# Patient Record
Sex: Female | Born: 1955 | Race: White | Hispanic: No | Smoking: Former smoker
Health system: Southern US, Community
[De-identification: ages and names within clinical notes are randomized; demographics above are authoritative.]

## PROBLEM LIST (undated history)

## (undated) DIAGNOSIS — C4491 Basal cell carcinoma of skin, unspecified: Secondary | ICD-10-CM

## (undated) DIAGNOSIS — K219 Gastro-esophageal reflux disease without esophagitis: Secondary | ICD-10-CM

## (undated) DIAGNOSIS — M1990 Unspecified osteoarthritis, unspecified site: Secondary | ICD-10-CM

## (undated) DIAGNOSIS — C44729 Squamous cell carcinoma of skin of left lower limb, including hip: Secondary | ICD-10-CM

## (undated) DIAGNOSIS — I10 Essential (primary) hypertension: Secondary | ICD-10-CM

## (undated) DIAGNOSIS — I82890 Acute embolism and thrombosis of other specified veins: Secondary | ICD-10-CM

## (undated) DIAGNOSIS — E538 Deficiency of other specified B group vitamins: Secondary | ICD-10-CM

## (undated) DIAGNOSIS — Z9889 Other specified postprocedural states: Secondary | ICD-10-CM

## (undated) DIAGNOSIS — G4733 Obstructive sleep apnea (adult) (pediatric): Secondary | ICD-10-CM

## (undated) DIAGNOSIS — E559 Vitamin D deficiency, unspecified: Secondary | ICD-10-CM

## (undated) DIAGNOSIS — E7800 Pure hypercholesterolemia, unspecified: Secondary | ICD-10-CM

## (undated) DIAGNOSIS — R112 Nausea with vomiting, unspecified: Secondary | ICD-10-CM

## (undated) HISTORY — PX: TUBAL LIGATION: SHX77

## (undated) HISTORY — PX: COLONOSCOPY: SHX174

## (undated) HISTORY — PX: APPENDECTOMY: SHX54

---
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
## 1997-09-25 HISTORY — PX: TOTAL ABDOMINAL HYSTERECTOMY W/ BILATERAL SALPINGOOPHORECTOMY: SHX83

## 2001-09-25 HISTORY — PX: ROTATOR CUFF REPAIR: SHX139

## 2003-09-26 DIAGNOSIS — K819 Cholecystitis, unspecified: Secondary | ICD-10-CM

## 2003-09-26 HISTORY — PX: CHOLECYSTECTOMY: SHX55

## 2003-09-26 HISTORY — DX: Cholecystitis, unspecified: K81.9

## 2005-08-13 ENCOUNTER — Emergency Department: Payer: Self-pay | Admitting: Emergency Medicine

## 2006-08-02 ENCOUNTER — Emergency Department: Payer: Self-pay

## 2009-04-30 ENCOUNTER — Emergency Department: Payer: Self-pay | Admitting: Internal Medicine

## 2010-06-29 ENCOUNTER — Emergency Department: Payer: Self-pay | Admitting: Internal Medicine

## 2011-09-26 DIAGNOSIS — M810 Age-related osteoporosis without current pathological fracture: Secondary | ICD-10-CM

## 2011-09-26 HISTORY — DX: Age-related osteoporosis without current pathological fracture: M81.0

## 2011-11-30 DIAGNOSIS — C4491 Basal cell carcinoma of skin, unspecified: Secondary | ICD-10-CM | POA: Insufficient documentation

## 2011-11-30 DIAGNOSIS — H9313 Tinnitus, bilateral: Secondary | ICD-10-CM | POA: Insufficient documentation

## 2011-11-30 DIAGNOSIS — K219 Gastro-esophageal reflux disease without esophagitis: Secondary | ICD-10-CM | POA: Insufficient documentation

## 2012-01-18 IMAGING — CT CT HEAD WITHOUT CONTRAST
2 series · 16 of 30 positions shown, 20 images · non-contrast
Comparison: none

REASON FOR EXAM: dizzy
COMMENTS:

PROCEDURE:     CT  - CT HEAD WITHOUT CONTRAST  - June 29, 2010  [DATE]
RESULT:     Comparison:  None
TECHNIQUE: Multiple axial images from the foramen magnum to the vertex were
obtained without IV contrast.

[Series 2: without · axial · non-contrast · 0.41mm/px · z∈[+948,+1084]mm · 13 of 33 slices shown, 17 images]
[im 3/33  brain]
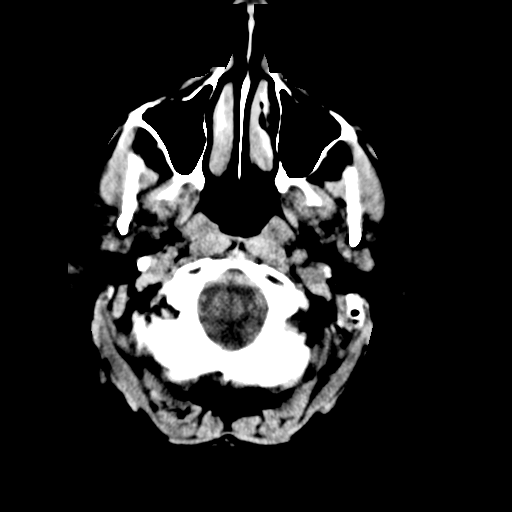
[im 3/33  bone]
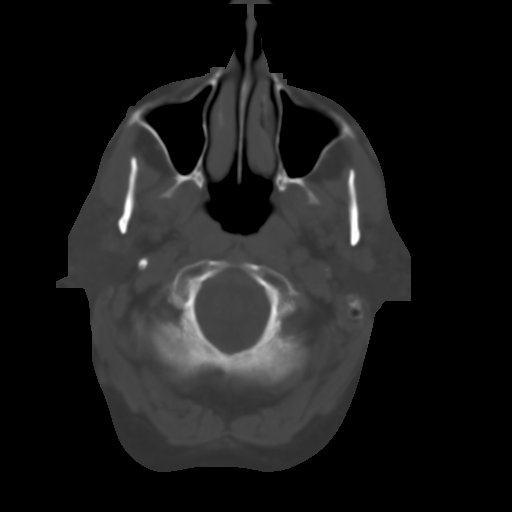
[im 5/33  brain]
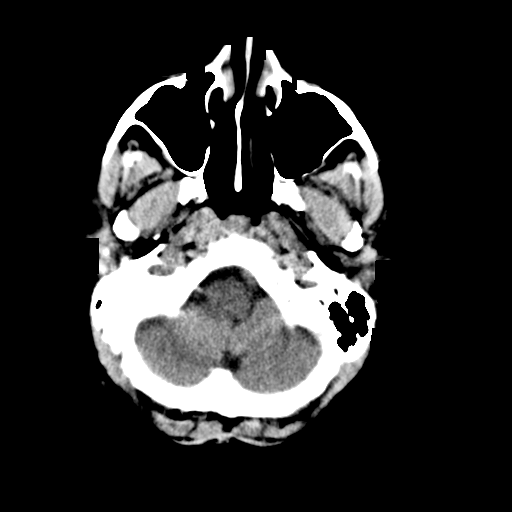
[im 7/33  brain]
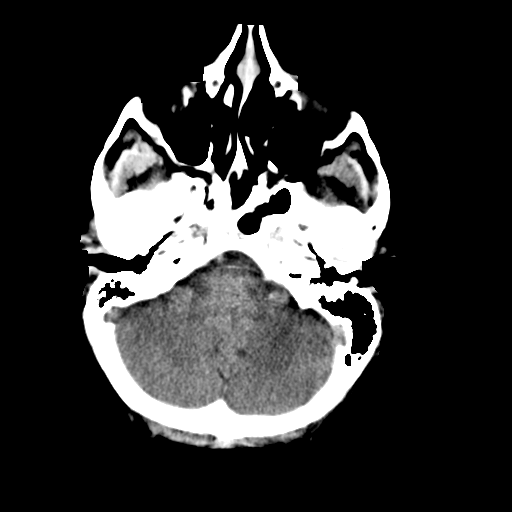
[im 10/33  brain]
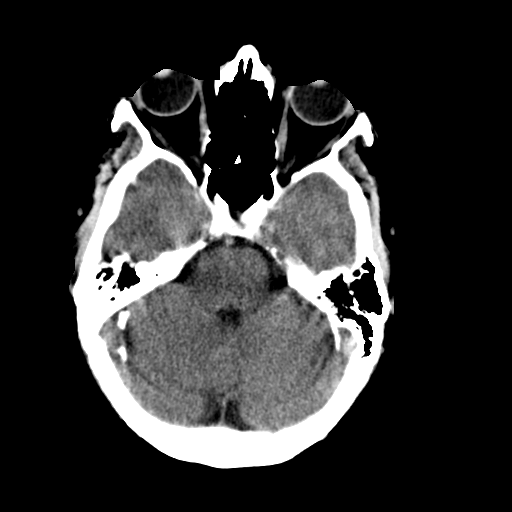
[im 12/33  brain]
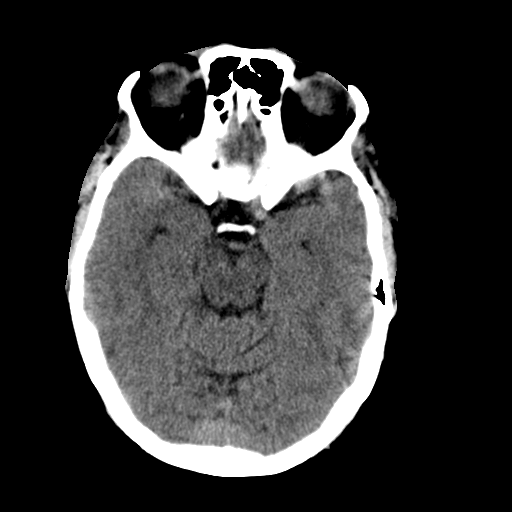
[im 12/33  bone]
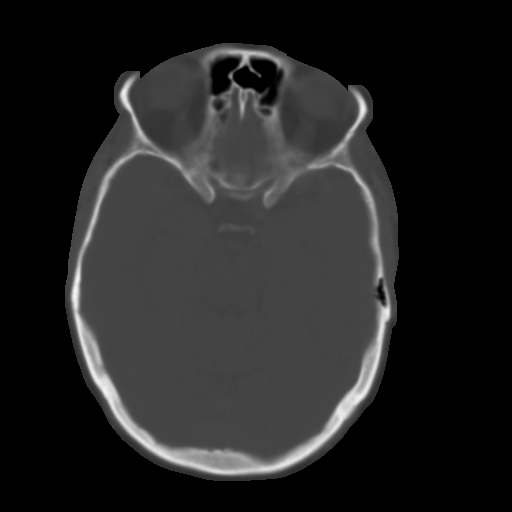
[im 14/33  brain]
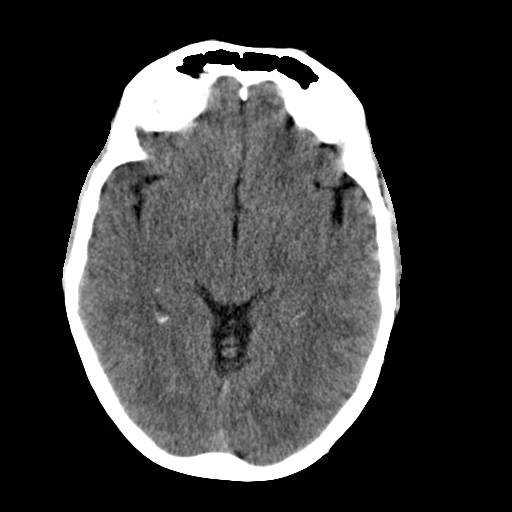
[im 17/33  brain]
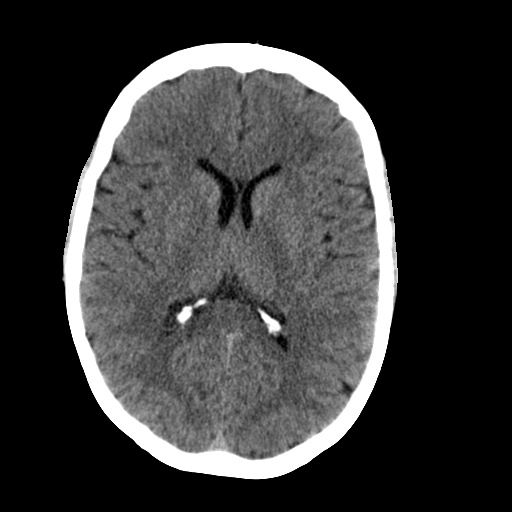
[im 19/33  brain]
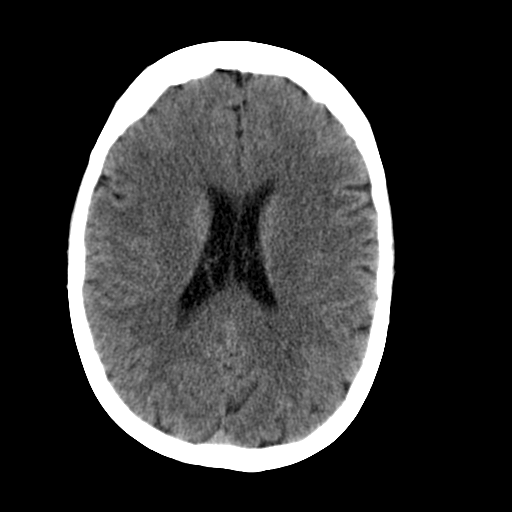
[im 21/33  brain]
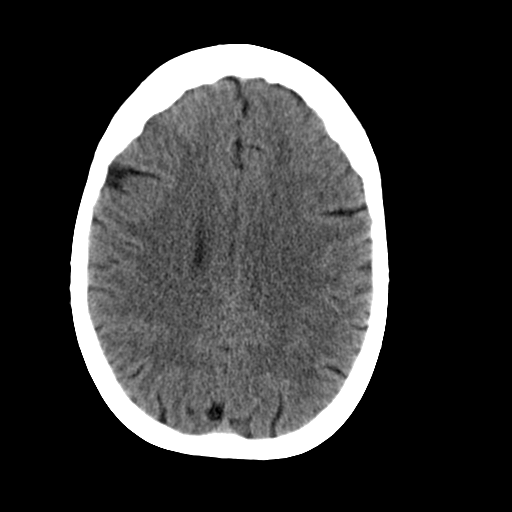
[im 21/33  bone]
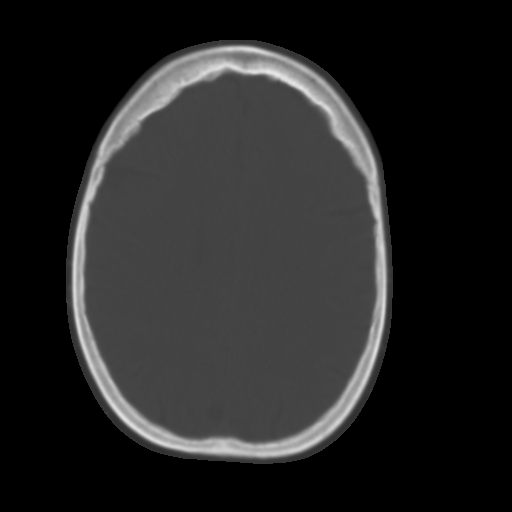
[im 23/33  brain]
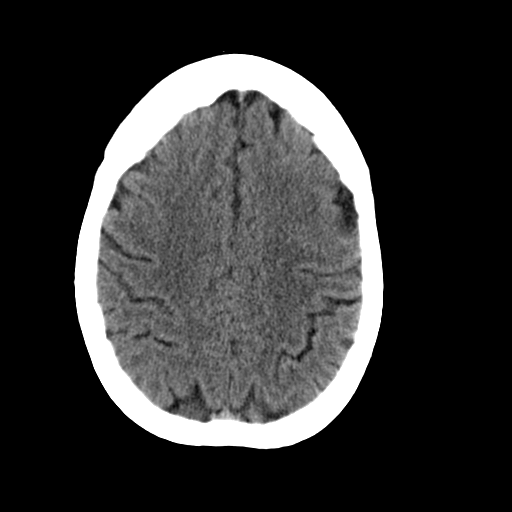
[im 26/33  brain]
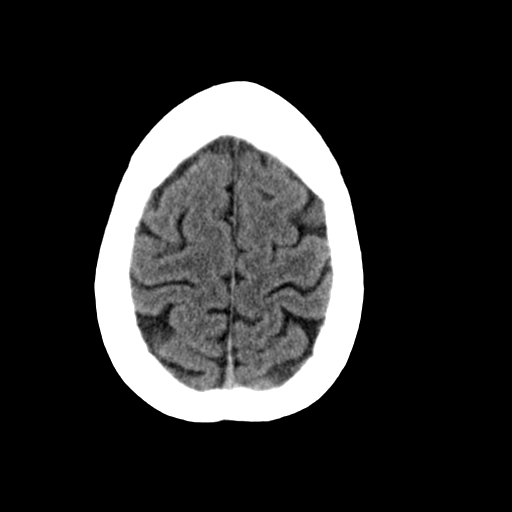
[im 28/33  brain]
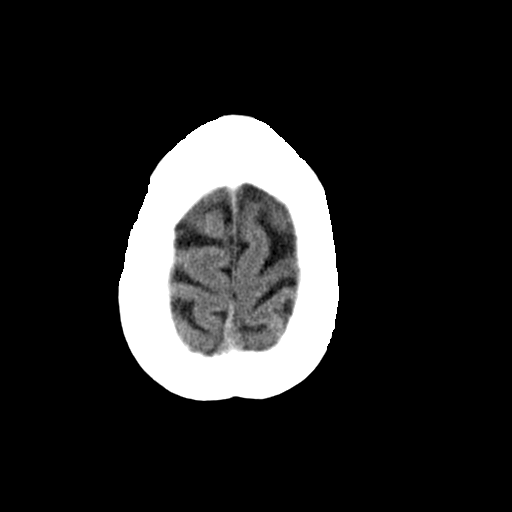
[im 30/33  brain]
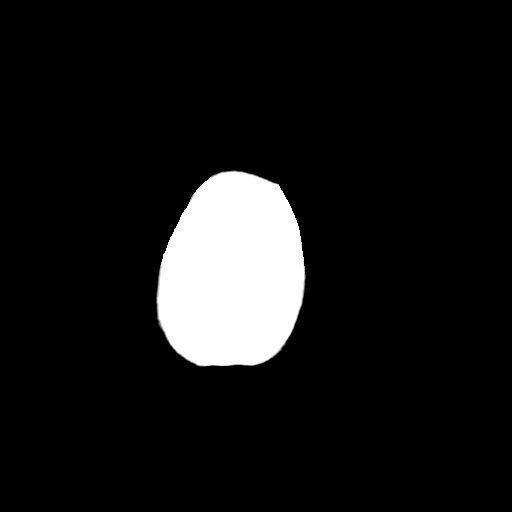
[im 30/33  bone]
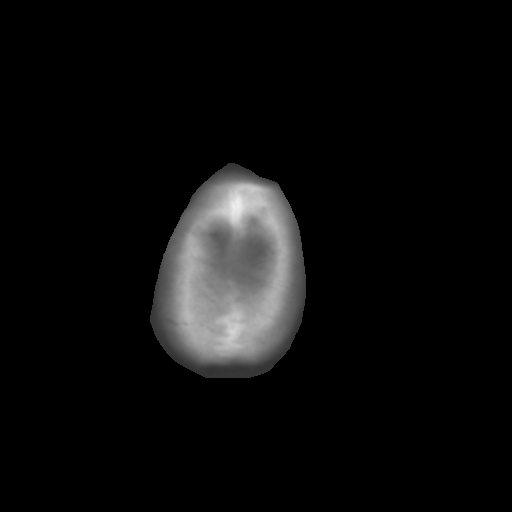

[Series 3: bone · axial · 0.41mm/px · z∈[+948,+994]mm · 3 of 33 slices shown]
[im 3/33  bone]
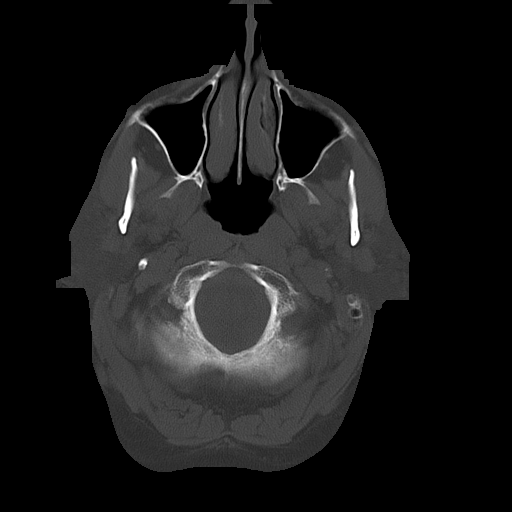
[im 7/33  bone]
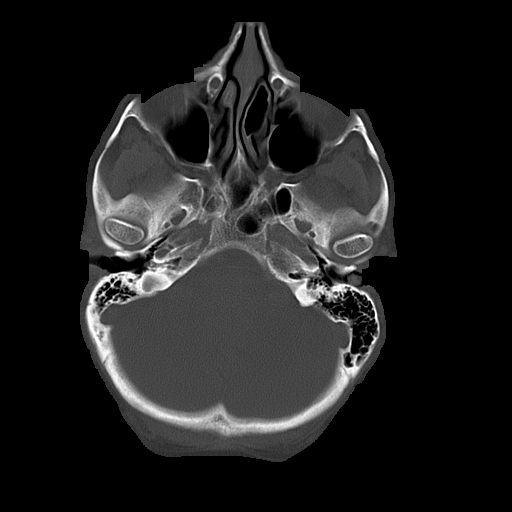
[im 12/33  bone]
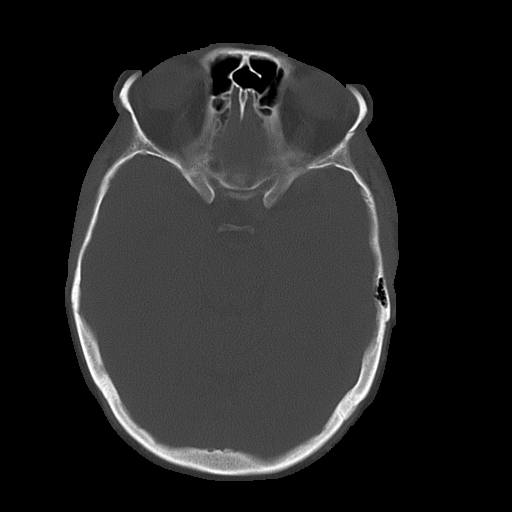

[16 of 30 positions shown; findings below may reference images not displayed]

FINDINGS: There is no evidence for mass effect, midline shift, or extra-axial fluid
collections. There is no evidence for space-occupying lesion, intracranial
hemorrhage, or cortical-based area of infarction.

The osseous structures are unremarkable.
IMPRESSION: No acute intracranial process.

## 2012-02-09 DIAGNOSIS — I10 Essential (primary) hypertension: Secondary | ICD-10-CM | POA: Insufficient documentation

## 2012-04-11 DIAGNOSIS — E559 Vitamin D deficiency, unspecified: Secondary | ICD-10-CM | POA: Insufficient documentation

## 2012-04-11 DIAGNOSIS — M810 Age-related osteoporosis without current pathological fracture: Secondary | ICD-10-CM | POA: Insufficient documentation

## 2012-12-10 DIAGNOSIS — M25561 Pain in right knee: Secondary | ICD-10-CM | POA: Insufficient documentation

## 2013-02-11 DIAGNOSIS — D369 Benign neoplasm, unspecified site: Secondary | ICD-10-CM | POA: Insufficient documentation

## 2013-09-25 DIAGNOSIS — K5792 Diverticulitis of intestine, part unspecified, without perforation or abscess without bleeding: Secondary | ICD-10-CM

## 2013-09-25 HISTORY — DX: Diverticulitis of intestine, part unspecified, without perforation or abscess without bleeding: K57.92

## 2013-12-17 HISTORY — PX: LAPAROSCOPIC PARTIAL COLECTOMY: SHX5907

## 2014-03-10 DIAGNOSIS — G4733 Obstructive sleep apnea (adult) (pediatric): Secondary | ICD-10-CM | POA: Insufficient documentation

## 2015-02-03 DIAGNOSIS — M810 Age-related osteoporosis without current pathological fracture: Secondary | ICD-10-CM | POA: Insufficient documentation

## 2016-03-10 DIAGNOSIS — N3946 Mixed incontinence: Secondary | ICD-10-CM | POA: Insufficient documentation

## 2016-03-10 DIAGNOSIS — E7800 Pure hypercholesterolemia, unspecified: Secondary | ICD-10-CM | POA: Insufficient documentation

## 2016-06-16 DIAGNOSIS — H8112 Benign paroxysmal vertigo, left ear: Secondary | ICD-10-CM | POA: Insufficient documentation

## 2016-06-16 DIAGNOSIS — H903 Sensorineural hearing loss, bilateral: Secondary | ICD-10-CM | POA: Insufficient documentation

## 2017-03-11 DIAGNOSIS — E782 Mixed hyperlipidemia: Secondary | ICD-10-CM | POA: Insufficient documentation

## 2017-07-05 DIAGNOSIS — M1712 Unilateral primary osteoarthritis, left knee: Secondary | ICD-10-CM | POA: Insufficient documentation

## 2017-09-25 DIAGNOSIS — L03115 Cellulitis of right lower limb: Secondary | ICD-10-CM

## 2017-09-25 HISTORY — DX: Cellulitis of right lower limb: L03.115

## 2018-03-11 HISTORY — PX: TOTAL KNEE ARTHROPLASTY: SHX125

## 2018-05-20 DIAGNOSIS — Z96651 Presence of right artificial knee joint: Secondary | ICD-10-CM | POA: Insufficient documentation

## 2018-09-25 DIAGNOSIS — C44622 Squamous cell carcinoma of skin of right upper limb, including shoulder: Secondary | ICD-10-CM

## 2018-09-25 HISTORY — DX: Squamous cell carcinoma of skin of right upper limb, including shoulder: C44.622

## 2019-04-08 DIAGNOSIS — M8580 Other specified disorders of bone density and structure, unspecified site: Secondary | ICD-10-CM | POA: Insufficient documentation

## 2021-01-18 DIAGNOSIS — Z85828 Personal history of other malignant neoplasm of skin: Secondary | ICD-10-CM | POA: Insufficient documentation

## 2021-08-15 NOTE — Discharge Instructions (Signed)
Instructions after Total Knee Replacement   Raelie Lohr P. Mervil Wacker, Jr., M.D.     Dept. of Orthopaedics & Sports Medicine  [facility]  [address]  [place], [place]  [address]  Phone: [phone]   Fax: [phone]    DIET: Drink plenty of non-alcoholic fluids. Resume your normal diet. Include foods high in fiber.  ACTIVITY:  You may use crutches or a walker with weight-bearing as tolerated, unless instructed otherwise. You may be weaned off of the walker or crutches by your Physical Therapist.  Do NOT place pillows under the knee. Anything placed under the knee could limit your ability to straighten the knee.   Continue doing gentle exercises. Exercising will reduce the pain and swelling, increase motion, and prevent muscle weakness.   Please continue to use the TED compression stockings for 6 weeks. You may remove the stockings at night, but should reapply them in the morning. Do not drive or operate any equipment until instructed.  WOUND CARE:  Continue to use the PolarCare or ice packs periodically to reduce pain and swelling. You may bathe or shower after the staples are removed at the first office visit following surgery.  MEDICATIONS: You may resume your regular medications. Please take the pain medication as prescribed on the medication. Do not take pain medication on an empty stomach. You have been given a prescription for a blood thinner (Lovenox or Coumadin). Please take the medication as instructed. (NOTE: After completing a 2 week course of Lovenox, take one Enteric-coated aspirin once a day. This along with elevation will help reduce the possibility of phlebitis in your operated leg.) Do not drive or drink alcoholic beverages when taking pain medications.  CALL THE OFFICE FOR: Temperature above 101 degrees Excessive bleeding or drainage on the dressing. Excessive swelling, coldness, or paleness of the toes. Persistent nausea and vomiting.  FOLLOW-UP:  You  should have an appointment to return to the office in 10-14 days after surgery. Arrangements have been made for continuation of Physical Therapy (either home therapy or outpatient therapy).   [facility] Department Directory         [email or web address]       [email or web address]          Cardiology  Appointments: [place] - [phone] [place] - [phone]  Endocrinology  Appointments: [place] - [phone] [place] - [phone]  Gastroenterology  Appointments: [place] - [phone] [place] - [phone]        General Surgery   Appointments: [place] - [phone]  Internal Medicine/Family Medicine  Appointments: [place] - [phone] [place] - [phone] [place] - [phone]  Metabolic and Weigh Loss Surgery  Appointments: [place] - [phone]        Neurology  Appointments: [place] - [phone] [place] - [phone]  Neurosurgery  Appointments: [place] - [phone]  Obstetrics & Gynecology  Appointments: [place] - [phone] [place] - [phone]        Pediatrics  Appointments: [place] - [phone] [place] - [phone]  Physiatry  Appointments: [place] -[phone]  Physical Therapy  Appointments: [place] - [phone] [place] - [phone]        Podiatry  Appointments: [place] - [phone] [place] - [phone]  Pulmonology  Appointments: [place] - [phone]  Rheumatology  Appointments: [place] - [phone]        [place] Location: [facility]  [address] , [place]  [address]  [place] Location: [facility] [address] [place], [place]  [address]  [place] Location: [facility] [address] [place], [place]  [address]    

## 2021-08-24 ENCOUNTER — Other Ambulatory Visit: Payer: Self-pay

## 2021-09-02 ENCOUNTER — Other Ambulatory Visit: Payer: Self-pay

## 2021-09-05 DIAGNOSIS — M1712 Unilateral primary osteoarthritis, left knee: Secondary | ICD-10-CM

## 2021-09-25 DIAGNOSIS — T451X5A Adverse effect of antineoplastic and immunosuppressive drugs, initial encounter: Secondary | ICD-10-CM

## 2021-09-25 HISTORY — PX: PORTA CATH INSERTION: CATH118285

## 2021-09-25 HISTORY — DX: Adverse effect of antineoplastic and immunosuppressive drugs, initial encounter: T45.1X5A

## 2021-10-26 DIAGNOSIS — C50911 Malignant neoplasm of unspecified site of right female breast: Secondary | ICD-10-CM

## 2021-10-26 HISTORY — DX: Malignant neoplasm of unspecified site of right female breast: C50.911

## 2021-10-26 HISTORY — PX: BREAST BIOPSY: SHX20

## 2021-10-27 DIAGNOSIS — E669 Obesity, unspecified: Secondary | ICD-10-CM | POA: Insufficient documentation

## 2021-10-27 DIAGNOSIS — G8929 Other chronic pain: Secondary | ICD-10-CM | POA: Insufficient documentation

## 2021-10-27 DIAGNOSIS — Z9889 Other specified postprocedural states: Secondary | ICD-10-CM | POA: Insufficient documentation

## 2021-11-07 ENCOUNTER — Inpatient Hospital Stay: Admission: RE | Admit: 2021-11-07 | Payer: Self-pay | Source: Ambulatory Visit

## 2021-11-18 ENCOUNTER — Other Ambulatory Visit: Admission: RE | Admit: 2021-11-18 | Payer: Self-pay | Source: Ambulatory Visit

## 2021-11-21 ENCOUNTER — Ambulatory Visit: Admit: 2021-11-21 | Payer: Self-pay | Admitting: Orthopedic Surgery

## 2021-11-21 DIAGNOSIS — M1712 Unilateral primary osteoarthritis, left knee: Secondary | ICD-10-CM

## 2021-11-21 SURGERY — ARTHROPLASTY, KNEE, TOTAL, USING IMAGELESS COMPUTER-ASSISTED NAVIGATION
Anesthesia: Choice | Site: Knee | Laterality: Left

## 2021-12-23 DIAGNOSIS — K1231 Oral mucositis (ulcerative) due to antineoplastic therapy: Secondary | ICD-10-CM | POA: Insufficient documentation

## 2021-12-23 DIAGNOSIS — K0381 Cracked tooth: Secondary | ICD-10-CM | POA: Insufficient documentation

## 2022-04-11 HISTORY — PX: BREAST LUMPECTOMY WITH AXILLARY LYMPH NODE BIOPSY: SHX5593

## 2022-12-07 DIAGNOSIS — G620 Drug-induced polyneuropathy: Secondary | ICD-10-CM | POA: Insufficient documentation

## 2022-12-07 DIAGNOSIS — D696 Thrombocytopenia, unspecified: Secondary | ICD-10-CM | POA: Insufficient documentation

## 2022-12-07 DIAGNOSIS — E538 Deficiency of other specified B group vitamins: Secondary | ICD-10-CM | POA: Insufficient documentation

## 2022-12-07 DIAGNOSIS — E876 Hypokalemia: Secondary | ICD-10-CM | POA: Insufficient documentation

## 2022-12-07 DIAGNOSIS — R7303 Prediabetes: Secondary | ICD-10-CM | POA: Insufficient documentation

## 2023-04-26 HISTORY — PX: PORT-A-CATH REMOVAL: SHX5289

## 2023-05-20 NOTE — Discharge Instructions (Signed)

## 2023-06-15 ENCOUNTER — Inpatient Hospital Stay: Admission: RE | Admit: 2023-06-15 | Payer: Self-pay | Source: Ambulatory Visit

## 2023-06-18 ENCOUNTER — Other Ambulatory Visit: Payer: Self-pay

## 2023-06-18 ENCOUNTER — Encounter
Admission: RE | Admit: 2023-06-18 | Discharge: 2023-06-18 | Disposition: A | Discharge status: Home / Self Care | Payer: Medicare HMO | Source: Ambulatory Visit | Attending: Orthopedic Surgery | Admitting: Orthopedic Surgery

## 2023-06-18 DIAGNOSIS — M1712 Unilateral primary osteoarthritis, left knee: Secondary | ICD-10-CM | POA: Diagnosis not present

## 2023-06-18 DIAGNOSIS — Z862 Personal history of diseases of the blood and blood-forming organs and certain disorders involving the immune mechanism: Secondary | ICD-10-CM | POA: Insufficient documentation

## 2023-06-18 DIAGNOSIS — Z01812 Encounter for preprocedural laboratory examination: Secondary | ICD-10-CM | POA: Insufficient documentation

## 2023-06-18 DIAGNOSIS — Z9221 Personal history of antineoplastic chemotherapy: Secondary | ICD-10-CM | POA: Diagnosis not present

## 2023-06-18 DIAGNOSIS — Z853 Personal history of malignant neoplasm of breast: Secondary | ICD-10-CM | POA: Insufficient documentation

## 2023-06-18 HISTORY — DX: Obstructive sleep apnea (adult) (pediatric): G47.33

## 2023-06-18 HISTORY — DX: Gastro-esophageal reflux disease without esophagitis: K21.9

## 2023-06-18 HISTORY — DX: Other specified postprocedural states: R11.2

## 2023-06-18 HISTORY — DX: Unspecified osteoarthritis, unspecified site: M19.90

## 2023-06-18 HISTORY — DX: Acute embolism and thrombosis of other specified veins: I82.890

## 2023-06-18 HISTORY — DX: Other specified postprocedural states: Z98.890

## 2023-06-18 HISTORY — DX: Deficiency of other specified B group vitamins: E53.8

## 2023-06-18 HISTORY — DX: Squamous cell carcinoma of skin of left lower limb, including hip: C44.729

## 2023-06-18 HISTORY — DX: Vitamin D deficiency, unspecified: E55.9

## 2023-06-18 HISTORY — DX: Essential (primary) hypertension: I10

## 2023-06-18 HISTORY — DX: Pure hypercholesterolemia, unspecified: E78.00

## 2023-06-18 HISTORY — DX: Basal cell carcinoma of skin, unspecified: C44.91

## 2023-06-18 LAB — TYPE AND SCREEN
ABO/RH(D): O POS
Antibody Screen: NEGATIVE

## 2023-06-18 LAB — COMPREHENSIVE METABOLIC PANEL
ALT: 21 U/L (ref 0–44)
AST: 26 U/L (ref 15–41)
Albumin: 4.1 g/dL (ref 3.5–5.0)
Alkaline Phosphatase: 44 U/L (ref 38–126)
Anion gap: 12 (ref 5–15)
BUN: 19 mg/dL (ref 8–23)
CO2: 24 mmol/L (ref 22–32)
Calcium: 9.1 mg/dL (ref 8.9–10.3)
Chloride: 102 mmol/L (ref 98–111)
Creatinine, Ser: 0.65 mg/dL (ref 0.44–1.00)
GFR, Estimated: >60 mL/min (ref >60)
Glucose, Bld: 127 mg/dL — ABNORMAL HIGH (ref 70–99)
Potassium: 4 mmol/L (ref 3.5–5.1)
Sodium: 138 mmol/L (ref 135–145)
Total Bilirubin: 0.8 mg/dL (ref 0.3–1.2)
Total Protein: 7.7 g/dL (ref 6.5–8.1)

## 2023-06-18 LAB — CBC
HCT: 41.4 % (ref 36.0–46.0)
Hemoglobin: 14.1 g/dL (ref 12.0–15.0)
MCH: 29.9 pg (ref 26.0–34.0)
MCHC: 34.1 g/dL (ref 30.0–36.0)
MCV: 87.7 fL (ref 80.0–100.0)
Platelets: 122 10*3/uL — ABNORMAL LOW (ref 150–400)
RBC: 4.72 MIL/uL (ref 3.87–5.11)
RDW: 13.4 % (ref 11.5–15.5)
WBC: 5.5 10*3/uL (ref 4.0–10.5)
nRBC: 0 % (ref 0.0–0.2)

## 2023-06-18 LAB — URINALYSIS, ROUTINE W REFLEX MICROSCOPIC
Bacteria, UA: NONE SEEN
Bilirubin Urine: NEGATIVE
Glucose, UA: NEGATIVE mg/dL
Hgb urine dipstick: NEGATIVE
Ketones, ur: NEGATIVE mg/dL
Nitrite: NEGATIVE
Protein, ur: NEGATIVE mg/dL
Specific Gravity, Urine: 1.02 (ref 1.005–1.030)
pH: 5 (ref 5.0–8.0)

## 2023-06-18 LAB — SURGICAL PCR SCREEN
MRSA, PCR: NEGATIVE
Staphylococcus aureus: POSITIVE — AB

## 2023-06-18 LAB — C-REACTIVE PROTEIN: CRP: 0.6 mg/dL (ref <1.0)

## 2023-06-18 LAB — SEDIMENTATION RATE: Sed Rate: 25 mm/hr (ref 0–30)

## 2023-06-18 NOTE — Patient Instructions (Addendum)
Your procedure is scheduled on: Friday, September 27 Report to the Registration Desk on the 1st floor of the [facility] To find out your arrival time, please call [phone] between 1PM - 3PM on: Thursday, September 26 If your arrival time is 6:00 am, do not arrive before that time as the [facility] entrance doors do not open until 6:00 am.  REMEMBER: Instructions that are not followed completely may result in serious medical risk, up to and including death; or upon the discretion of your surgeon and anesthesiologist your surgery may need to be rescheduled.  Do not eat food after midnight the night before surgery.  No gum chewing or hard candies.  You may however, drink CLEAR liquids up to 2 hours before you are scheduled to arrive for your surgery. Do not drink anything within 2 hours of your scheduled arrival time.  Clear liquids include: - water  - apple juice without pulp - gatorade (not RED colors) - black coffee or tea (Do NOT add milk or creamers to the coffee or tea) Do NOT drink anything that is not on this list.  In addition, your doctor has ordered for you to drink the provided:  Ensure Pre-Surgery Clear Carbohydrate Drink  Drinking this carbohydrate drink up to two hours before surgery helps to reduce insulin resistance and improve patient outcomes. Please complete drinking 2 hours before scheduled arrival time.  One week prior to surgery: starting today, September 23 Stop Anti-inflammatories (NSAIDS) such as Advil, Aleve, Ibuprofen, Motrin, Naproxen, Naprosyn and Aspirin based products such as Excedrin, Goody's Powder, BC Powder. Stop ANY OVER THE COUNTER supplements until after surgery. You may however, continue to take Tylenol if needed for pain up until the day of surgery.  Continue taking all prescribed medications   TAKE ONLY THESE MEDICATIONS THE MORNING OF SURGERY WITH A SIP OF WATER:  Letrozole (Femara) Pantoprazole (Protonix) Simvastatin  (Zocor)  Take a famotidine (Pepcid) the night before, it will help to prevent nausea after surgery.  No Alcohol for 24 hours before or after surgery.  No Smoking including e-cigarettes for 24 hours before surgery.  No chewable tobacco products for at least 6 hours before surgery.  No nicotine patches on the day of surgery.  Do not use any "recreational" drugs for at least a week (preferably 2 weeks) before your surgery.  Please be advised that the combination of cocaine and anesthesia may have negative outcomes, up to and including death. If you test positive for cocaine, your surgery will be cancelled.  On the morning of surgery brush your teeth with toothpaste and water, you may rinse your mouth with mouthwash if you wish. Do not swallow any toothpaste or mouthwash.  Use CHG Soap as directed on instruction sheet.  Do not wear jewelry, make-up, hairpins, clips or nail polish.  For welded (permanent) jewelry: bracelets, anklets, waist bands, etc.  Please have this removed prior to surgery.  If it is not removed, there is a chance that hospital personnel will need to cut it off on the day of surgery.  Do not wear lotions, powders, or perfumes.   Do not shave body hair from the neck down 48 hours before surgery.  Contact lenses, hearing aids and dentures may not be worn into surgery.  Do not bring valuables to the hospital. [facility] is not responsible for any missing/lost belongings or valuables.   Notify your doctor if there is any change in your medical condition (cold, fever, infection).  Wear comfortable  clothing (specific to your surgery type) to the hospital.  After surgery, you can help prevent lung complications by doing breathing exercises.  Take deep breaths and cough every 1-2 hours. Your doctor may order a device called an Incentive Spirometer to help you take deep breaths.  If you are being admitted to the hospital overnight, leave your suitcase in the car. After  surgery it may be brought to your room.  In case of increased patient census, it may be necessary for you, the patient, to continue your postoperative care in the Same Day Surgery department.  If you are being discharged the day of surgery, you will not be allowed to drive home. You will need a responsible individual to drive you home and stay with you for 24 hours after surgery.   If you are taking public transportation, you will need to have a responsible individual with you.  Please call the Pre-admissions Testing Dept. at [phone] if you have any questions about these instructions.  Surgery Visitation Policy:  Patients having surgery or a procedure may have two visitors.  Children under the age of 26 must have an adult with them who is not the patient.  Inpatient Visitation:    Visiting hours are 7 a.m. to 8 p.m. Up to four visitors are allowed at one time in a patient room. The visitors may rotate out with other people during the day.  One visitor age 27 or older may stay with the patient overnight and must be in the room by 8 p.m.      Pre-operative 5 CHG Bath Instructions   You can play a key role in reducing the risk of infection after surgery. Your skin needs to be as free of germs as possible. You can reduce the number of germs on your skin by washing with CHG (chlorhexidine gluconate) soap before surgery. CHG is an antiseptic soap that kills germs and continues to kill germs even after washing.   DO NOT use if you have an allergy to chlorhexidine/CHG or antibacterial soaps. If your skin becomes reddened or irritated, stop using the CHG and notify one of our RNs at [phone].   Please shower with the CHG soap starting 4 days before surgery using the following schedule:     Please keep in mind the following:  DO NOT shave, including legs and underarms, starting the day of your first shower.   You may shave your face at any point before/day of surgery.  Place  clean sheets on your bed the day you start using CHG soap. Use a clean washcloth (not used since being washed) for each shower. DO NOT sleep with pets once you start using the CHG.   CHG Shower Instructions:  If you choose to wash your hair and private area, wash first with your normal shampoo/soap.  After you use shampoo/soap, rinse your hair and body thoroughly to remove shampoo/soap residue.  Turn the water OFF and apply about 3 tablespoons (45 ml) of CHG soap to a CLEAN washcloth.  Apply CHG soap ONLY FROM YOUR NECK DOWN TO YOUR TOES (washing for 3-5 minutes)  DO NOT use CHG soap on face, private areas, open wounds, or sores.  Pay special attention to the area where your surgery is being performed.  If you are having back surgery, having someone wash your back for you may be helpful. Wait 2 minutes after CHG soap is applied, then you may rinse off the CHG soap.  Pat dry with  a clean towel  Put on clean clothes/pajamas   If you choose to wear lotion, please use ONLY the CHG-compatible lotions on the back of this paper.     Additional instructions for the day of surgery: DO NOT APPLY any lotions, deodorants, cologne, or perfumes.   Put on clean/comfortable clothes.  Brush your teeth.  Ask your nurse before applying any prescription medications to the skin.      CHG Compatible Lotions   Aveeno Moisturizing lotion  Cetaphil Moisturizing Cream  Cetaphil Moisturizing Lotion  Clairol Herbal Essence Moisturizing Lotion, Dry Skin  Clairol Herbal Essence Moisturizing Lotion, Extra Dry Skin  Clairol Herbal Essence Moisturizing Lotion, Normal Skin  Curel Age Defying Therapeutic Moisturizing Lotion with Alpha Hydroxy  Curel Extreme Care Body Lotion  Curel Soothing Hands Moisturizing Hand Lotion  Curel Therapeutic Moisturizing Cream, Fragrance-Free  Curel Therapeutic Moisturizing Lotion, Fragrance-Free  Curel Therapeutic Moisturizing Lotion, Original Formula  Eucerin Daily Replenishing  Lotion  Eucerin Dry Skin Therapy Plus Alpha Hydroxy Crme  Eucerin Dry Skin Therapy Plus Alpha Hydroxy Lotion  Eucerin Original Crme  Eucerin Original Lotion  Eucerin Plus Crme Eucerin Plus Lotion  Eucerin TriLipid Replenishing Lotion  Keri Anti-Bacterial Hand Lotion  Keri Deep Conditioning Original Lotion Dry Skin Formula Softly Scented  Keri Deep Conditioning Original Lotion, Fragrance Free Sensitive Skin Formula  Keri Lotion Fast Absorbing Fragrance Free Sensitive Skin Formula  Keri Lotion Fast Absorbing Softly Scented Dry Skin Formula  Keri Original Lotion  Keri Skin Renewal Lotion Keri Silky Smooth Lotion  Keri Silky Smooth Sensitive Skin Lotion  Nivea Body Creamy Conditioning Oil  Nivea Body Extra Enriched Lotion  Nivea Body Original Lotion  Nivea Body Sheer Moisturizing Lotion Nivea Crme  Nivea Skin Firming Lotion  NutraDerm 30 Skin Lotion  NutraDerm Skin Lotion  NutraDerm Therapeutic Skin Cream  NutraDerm Therapeutic Skin Lotion  ProShield Protective Hand Cream  Provon moisturizing lotion    Preoperative Educational Videos for Total Hip, Knee and Shoulder Replacements  To better prepare for surgery, please view our videos that explain the physical activity and discharge planning required to have the best surgical recovery at [facility].  [email or web address]  Questions? Call [phone] or email [email or web address]      How to Use an Incentive Spirometer  An incentive spirometer is a tool that measures how well you are filling your lungs with each breath. Learning to take long, deep breaths using this tool can help you keep your lungs clear and active. This may help to reverse or lessen your chance of developing breathing (pulmonary) problems, especially infection. You may be asked to use a spirometer: After a surgery. If you have a lung problem or a history of smoking. After a long period of time when you have  been unable to move or be active. If the spirometer includes an indicator to show the highest number that you have reached, your health care provider or respiratory therapist will help you set a goal. Keep a log of your progress as told by your health care provider. What are the risks? Breathing too quickly may cause dizziness or cause you to pass out. Take your time so you do not get dizzy or light-headed. If you are in pain, you may need to take pain medicine before doing incentive spirometry. It is harder to take a deep breath if you are having pain. How to use your incentive spirometer  Sit up on the edge of your bed or on a chair. Hold the  incentive spirometer so that it is in an upright position. Before you use the spirometer, breathe out normally. Place the mouthpiece in your mouth. Make sure your lips are closed tightly around it. Breathe in slowly and as deeply as you can through your mouth, causing the piston or the ball to rise toward the top of the chamber. Hold your breath for 3-5 seconds, or for as long as possible. If the spirometer includes a coach indicator, use this to guide you in breathing. Slow down your breathing if the indicator goes above the marked areas. Remove the mouthpiece from your mouth and breathe out normally. The piston or ball will return to the bottom of the chamber. Rest for a few seconds, then repeat the steps 10 or more times. Take your time and take a few normal breaths between deep breaths so that you do not get dizzy or light-headed. Do this every 1-2 hours when you are awake. If the spirometer includes a goal marker to show the highest number you have reached (best effort), use this as a goal to work toward during each repetition. After each set of 10 deep breaths, cough a few times. This will help to make sure that your lungs are clear. If you have an incision on your chest or abdomen from surgery, place a pillow or a rolled-up towel firmly against the  incision when you cough. This can help to reduce pain while taking deep breaths and coughing. General tips When you are able to get out of bed: Walk around often. Continue to take deep breaths and cough in order to clear your lungs. Keep using the incentive spirometer until your health care provider says it is okay to stop using it. If you have been in the hospital, you may be told to keep using the spirometer at home. Contact a health care provider if: You are having difficulty using the spirometer. You have trouble using the spirometer as often as instructed. Your pain medicine is not giving enough relief for you to use the spirometer as told. You have a fever. Get help right away if: You develop shortness of breath. You develop a cough with bloody mucus from the lungs. You have fluid or blood coming from an incision site after you cough. Summary An incentive spirometer is a tool that can help you learn to take long, deep breaths to keep your lungs clear and active. You may be asked to use a spirometer after a surgery, if you have a lung problem or a history of smoking, or if you have been inactive for a long period of time. Use your incentive spirometer as instructed every 1-2 hours while you are awake. If you have an incision on your chest or abdomen, place a pillow or a rolled-up towel firmly against your incision when you cough. This will help to reduce pain. Get help right away if you have shortness of breath, you cough up bloody mucus, or blood comes from your incision when you cough. This information is not intended to replace advice given to you by your health care provider. Make sure you discuss any questions you have with your health care provider. Document Revised: 12/01/2019 Document Reviewed: 12/01/2019 Elsevier Patient Education  2023 [employer].

## 2023-06-18 NOTE — H&P (Signed)
ORTHOPAEDIC HISTORY & PHYSICAL Mariah Hanson, Mariah Hanson., MD - 06/12/2023 11:30 AM EDT Formatting of this note is different from the original. Images from the original note were not included. Chief Complaint: Chief Complaint Patient presents with Left knee degenerative arthrosis  Reason for Visit: The patient is a 67 y.o. female who presents today for reevaluation of her left knee. She has a long history of left knee pain. She localizes most of the pain along the medial aspect of the knee. She reports some swelling, no locking, and some giving way of the knee. The pain is aggravated by any weight bearing. The knee pain limits the patient's ability to ambulate long distances. The patient has not appreciated any significant improvement despite intra-articular corticosteroid injections, activity modification, and ambulatory aids.. She is using a cane for ambulation. The patient states that the knee pain has progressed to the point that it is significantly interfering with her activities of daily living.  Note, the patient had initially been scheduled for surgery 2 years ago but was diagnosed with right breast carcinoma. She has undergone chemotherapy, immunotherapy, lumpectomy, and adjuvant radiation therapy. 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 She has been cleared for surgery by her oncologist.  Medications: Current Outpatient Medications Medication Sig Dispense Refill acetaminophen (TYLENOL) 325 MG tablet Take 650 mg by mouth every 4 (four) hours as needed for Pain cyanocobalamin (VITAMIN B12) 1000 MCG tablet Take 1,000 mcg by mouth once daily denosumab (PROLIA SUBQ) Inject 1 Dose subcutaneously every 6 (six) months famotidine (PEPCID) 20 MG tablet Take 20 mg by mouth 2 (two) times daily as needed fluticasone propionate (FLONASE) 50 mcg/actuation nasal spray Place into one nostril once daily as needed gabapentin (NEURONTIN) 100 MG capsule Take 2 capsules (200 mg total)  by mouth 3 (three) times daily 180 capsule 11 letrozole (FEMARA) 2.5 mg tablet Take 1 tablet (2.5 mg total) by mouth once daily 90 tablet 1 lisinopriL (ZESTRIL) 10 MG tablet Take 1 tablet (10 mg total) by mouth once daily 90 tablet 3 nystatin (MYCOSTATIN) 100,000 unit/gram powder Apply topically 2 (two) times daily (Patient taking differently: Apply 1 Application topically 2 (two) times daily as needed) 180 g 7 pantoprazole (PROTONIX) 40 MG DR tablet Take 1 tablet (40 mg total) by mouth once daily 90 tablet 3 polyethylene glycol (MIRALAX) packet Take 17 g by mouth every other day Mix in 4-8ounces of fluid prior to taking. POLYETHYLENE GLYCOL 3350 ORAL Take 1 capsule by mouth once daily potassium chloride (KLOR-CON M20) 20 MEQ ER tablet Take 1 tablet by mouth once daily 30 tablet 0 prochlorperazine (COMPAZINE) 10 MG tablet Take 1 tablet (10 mg total) by mouth every 6 (six) hours as needed for Nausea or Vomiting 60 tablet 3 sennosides (SENOKOT) 8.6 mg tablet Take 2 tablets by mouth once daily simvastatin (ZOCOR) 20 MG tablet Take 1 tablet (20 mg total) by mouth at bedtime 90 tablet 3  No current facility-administered medications for this visit.  Allergies: Allergies Allergen Reactions Codeine Nausea and Vomiting Hydrocodone Vomiting GI UPSET Venlafaxine Headache Fatigue  Past Medical History: Past Medical History: Diagnosis Date Abnormal Pap smear Acute post-operative pain 03/11/2018 Allergic state Anxiety states 04/11/2019 Arthritis Basal cell adenocarcinoma Breast cancer, right breast (CMS/HHS-HCC) 10/2021 Broken or cracked tooth, nontraumatic 12/23/2021 Cancer (CMS/HHS-HCC) Cellulitis of right knee 05/20/2018 Cholecystitis 2005 cholecystectomy Diverticulitis 2015 Surgery in 2015 DVT (deep venous thrombosis) (CMS/HHS-HCC) 2015 Clot found in ovarian vein during sigmoid surg; 6 months of Eliquis Encounter for antineoplastic chemotherapy and  immunotherapy 11/29/2021 GERD  (gastroesophageal reflux disease) 2007 High triglycerides 09/11/2017 History of abnormal Pap smear 11/30/2011 History of chicken pox Hypercholesteremia Hypertension 02/09/2012 Moderate obstructive sleep apnea 03/10/2014 Mucositis due to chemotherapy 12/23/2021 Osteoarthritis 2013 Osteoporosis 04/11/2012 PONV (postoperative nausea and vomiting) Sleep apnea does not use CPAP 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, right 2020 Moh's surgery Squamous cell carcinoma in situ shin Tubular adenoma- colonoscopy June 24, 2012, she needs to follow up in 3 years with another colonoscopy 02/11/2013 Vitamin D deficiency 04/11/2012  Past Surgical History: Past Surgical History: Procedure Laterality Date TONSILLECTOMY 1963 with adenoids HYSTERECTOMY 1999 with removal tubes and ovaries rotator cuff surgery Right 2003 CHOLECYSTECTOMY 2005 SCREENING COLONOSCOPY N/A 06/14/2012 Procedure: SCREENING COLONOSCOPY; Surgeon: Rutherford Guys., MD; Location: [facility]; Service: Gastroenterology; Laterality: N/A; LAPAROSCOPIC COLECTOMY W/COLOPROCTOSTOMY PELVIC ANASTAMOSIS ROBOTIC 12/17/2013 Procedure: LAPAROSCOPIC COLECTOMY PARTIAL W/ANASTAMOSIS & COLOPROCTOSTOMY PELVIC ANASTAMOSIS; Surgeon: Clifton Sweet Jarvis, MD; Location: [facility]; Service: General Surgery;; PROCTOSIGMOIDOSCOPY RIGID 12/17/2013 Procedure: PROCTOSIGMOIDOSCOPY RIGID; Surgeon: Clifton Tashina Credit, MD; Location: [facility]; Service: General Surgery;; CYSTOURETHROSCOPY W/INSERTION/EXCHANGE URETERAL STENT Left 12/17/2013 Procedure: CYSTOURETHROSCOPY WITH INSERTION / EXCHANGE URETERAL STENT/left ureteral stent; Surgeon: Sheran Fava, MD; Location: [facility]; Service: Urology; Laterality: Left; COLONOSCOPY N/A 07/15/2015 Procedure: COLORECTAL CANCER SCREENING; COLONOSCOPY ON INDIVIDUAL AT HIGH RISK; Surgeon: Dianne Dun, MD; Location: [facility]; Service: Gastroenterology;  Laterality: N/A; COLONOSCOPY 2019 ARTHROPLASTY TOTAL KNEE Right 03/11/2018 Procedure: ARTHROPLASTY, KNEE, CONDYLE AND PLATEAU; MEDIAL AND LATERAL COMPARTMENTSWITH OR WITHOUT PATELLA RESURFACING (TOTAL KNEE ARTHROPLASTY); Surgeon: Currie Paris, MD; Location: [facility]; Service: Orthopedics; Laterality: Right; PERCUTANEOUS BIOPSY BREAST VACUUM ASSISTED W/NEEDLE LOCALIZATION Right 10/2021 triple POS MASTECTOMY PARTIAL Right 04/11/2022 Procedure: RIGHT BREAST LUMPECTOMY WITH RIGHT AXILLARY SENTINEL NODE BIOPSY; Surgeon: Delynn Flavin, MD; Location: [facility]; Service: General Surgery; Laterality: Right; BIOPSY/EXCISION LYMPH NODE AXILLARY Right 04/11/2022 Procedure: BIOPSY OR EXCISION OF LYMPH NODE(S); OPEN, DEEP AXILLARY NODE(S); Surgeon: Delynn Flavin, MD; Location: [facility]; Service: General Surgery; Laterality: Right; INTRAOPERATIVE IDENTIFICATION OF SENTINEL LYMPH NODE(S) W/ INJECTION OF NON-RADIOACTIVE DYE Right 04/11/2022 Procedure: INTRAOPERATIVE IDENTIFICATION (EG, MAPPING) OF SENTINEL LYMPH NODE(S) INCLUDES INJECTION OF NON-RADIOACTIVE DYE, WHEN PERFORMED (LIST IN ADDITION TO PRIMARY PROCEDURE); Surgeon: Delynn Flavin, MD; Location: [facility] OR; Service: General Surgery; Laterality: Right; APPENDECTOMY ARTHROPLASTY TOTAL KNEE Right BREAST BIOPSY 11/01/2021 COLON SURGERY 2015 EXPLORATORY LAPAROTOMY OOPHORECTOMY 1999 PERCUTANEOUS PORTAL VEIN CATHETERIZATION Left upper left port a cath STOMACH SURGERY 2015 TUBAL LIGATION  Social History: Social History  Socioeconomic History Marital status: Divorced Number of children: 2 Years of education: 16 Highest education level: Bachelor's degree (e.g., BA, AB, BS) Occupational History Occupation: Retired- Part time -[employer] Occupation: retired [employer] Biomedical engineer: [employer] Tobacco Use Smoking status: Former Current packs/day: 0.00 Average packs/day: 0.5 packs/day for 15.0 years  (7.5 ttl pk-yrs) Types: Cigarettes Start date: 09/25/1974 Quit date: 09/25/1989 Years since quitting: 33.7 Passive exposure: Past Smokeless tobacco: Never Vaping Use Vaping status: Never Used Substance and Sexual Activity Alcohol use: Not Currently Drug use: No Sexual activity: Defer Partners: Male Birth control/protection: None  Social Determinants of Health  Financial Resource Strain: Low Risk (12/06/2022) Overall Financial Resource Strain (CARDIA) Difficulty of Paying Living Expenses: Not very hard Food Insecurity: No Food Insecurity (12/06/2022) Hunger Vital Sign Worried About Running Out of Food in the Last Year: Never true Ran Out of Food in the Last Year: Never true Transportation Needs: No Transportation Needs (12/06/2022) PRAPARE - Contractor (Medical): No  Lack of Transportation (Non-Medical): No Housing Stability: High Risk (12/06/2022) Housing Stability Vital Sign Unable to Pay for Housing in the Last Year: Yes Number of Places Lived in the Last Year: 1 Unstable Housing in the Last Year: No  Family History: Family History Problem Relation Name Age of Onset High blood pressure (Hypertension) Mother Brendaly Donnel Stroke Mother Lateshia Tomlinson during surgery for heart valve Aneurysm Mother Sereena Erdahl 70 AAA - killed pt Heart disease Mother Elnoria Schleisman heart failure; had defibrillator Ulcers Father Velen Fraze Macular degeneration Father Breda Dhein Reflux disease Father Tenile Ulery Hearing loss Father Millennium Sieker Headaches Sister Heart disease Brother Muscular dystrophy Brother died just before July 07, 2023 Osteoarthritis Maternal Aunt Adgie McPhail Melanoma Maternal Uncle in old age Heart disease Maternal Uncle Myocardial Infarction (Heart attack) Paternal Aunt 66 fatal age 86 Macular degeneration Maternal Grandmother Mother Stroke Maternal Grandmother Mother No Known Problems Paternal  Grandmother No Known Problems Paternal Grandfather Diabetes Son type 1 Glaucoma Other Breast cancer Other mat great aunt pre menopausal Anesthesia problems Neg Hx Malignant hypertension Neg Hx Malignant hyperthermia Neg Hx Pseudochol deficiency Neg Hx Leukemia Neg Hx Liver cancer Neg Hx Ovarian cancer Neg Hx Kidney cancer Neg Hx Colon cancer Neg Hx Brain cancer Neg Hx Pancreatic cancer Neg Hx Prostate cancer Neg Hx Rectal cancer Neg Hx Sarcoma (Cancer of soft tissue) Neg Hx Small bowel cancer Neg Hx Stomach cancer Neg Hx Uterine cancer Neg Hx Bladder Cancer Neg Hx Clotting disorder Neg Hx  Review of Systems: A comprehensive 14 point ROS was performed, reviewed, and the pertinent orthopaedic findings are documented in the HPI.  Exam BP 128/68  Ht 165.1 cm (5\' 5" )  Wt 91.6 kg (202 lb)  BMI 33.61 kg/m  General: Well-developed, well-nourished female seen in no acute distress. Antalgic gait. Varus thrust to the left knee.  HEENT: Atraumatic, normocephalic. Pupils are equal and reactive to light. Extraocular motion is intact. Sclera are clear. Oropharynx is clear with moist mucosa.  Neck: Supple, nontender, and with good ROM. No thyromegaly, adenopathy, JVD, or carotid bruits.  Lungs: Clear to auscultation bilaterally.  Cardiovascular: Regular rate and rhythm. Normal S1, S2. No murmur . No appreciable gallops or rubs. Peripheral pulses are palpable. No lower extremity edema. Homan`s test is negative.  Abdomen: Soft, nontender, nondistended. Bowel sounds are present.  Extremities: Good strength, stability, and range of motion of the upper extremities. Good range of motion of the hips and ankles.  Left Knee: Soft tissue swelling: mild Effusion: minimal Erythema: none Crepitance: mild Tenderness: medial Alignment: relative varus Mediolateral laxity: medial pseudolaxity Posterior sag: negative Patellar tracking: Good tracking without evidence of subluxation  or tilt Atrophy: No significant atrophy. Quadriceps tone was fair to good. Range of motion: 0/15/122 degrees  Neurologic: Awake, alert, and oriented. Sensory function is intact to pinprick and light touch. Motor strength is judged to be 5/5. Motor coordination is within normal limits. No apparent clonus. No tremor.  X-rays: I ordered and interpreted standing AP, lateral, and sunrise radiographs of the left knee that were obtained in the office today. There is significant narrowing of the medial cartilage space with bone-on-bone articulation and associated varus alignment. Osteophyte formation is noted. Subchondral sclerosis is noted. No evidence of fracture or dislocation.  Impression: Degenerative arthrosis of the left knee  Plan: The findings were discussed in detail with the patient. The patient was given informational material on total knee replacement. Conservative treatment options were reviewed with the  patient. We discussed the risks and benefits of surgical intervention. The usual perioperative course was also discussed in detail. The patient expressed understanding of the risks and benefits of surgical intervention and would like to proceed with plans for left total knee arthroplasty.  I spent a total of 40 minutes in both face-to-face and non-face-to-face activities, excluding procedures performed, for this visit on the date of this encounter.  MEDICAL CLEARANCE: Per anesthesiology. ACTIVITY: As tolerated. WORK STATUS: Not applicable. THERAPY: Preoperative physical therapy evaluation. MEDICATIONS: Requested Prescriptions  No prescriptions requested or ordered in this encounter  FOLLOW-UP: Return for postoperative follow-up.  Brittany Amirault P. Angie Fava., M.D.  This note was generated in part with voice recognition software and I apologize for any typographical errors that were not detected and corrected.  Electronically signed by Shari Heritage., MD at  06/17/2023 11:29 AM EDT

## 2023-06-21 MED ORDER — DEXAMETHASONE SODIUM PHOSPHATE 10 MG/ML IJ SOLN
8.0000 mg | Freq: Once | INTRAMUSCULAR | Status: AC
  Administered 2023-06-22: 8 mg via INTRAVENOUS

## 2023-06-21 MED ORDER — ORAL CARE MOUTH RINSE: 15.0000 mL | Freq: Once | OROMUCOSAL | Status: AC

## 2023-06-21 MED ORDER — CHLORHEXIDINE GLUCONATE 0.12 % MT SOLN
15.0000 mL | Freq: Once | OROMUCOSAL | Status: AC
  Administered 2023-06-22: 15 mL via OROMUCOSAL

## 2023-06-21 MED ORDER — CHLORHEXIDINE GLUCONATE 4 % EX SOLN
60.0000 mL | Freq: Once | CUTANEOUS | Status: AC
  Administered 2023-06-22: 4 via TOPICAL

## 2023-06-21 MED ORDER — CELECOXIB 200 MG PO CAPS
400.0000 mg | ORAL_CAPSULE | Freq: Once | ORAL | Status: AC
  Administered 2023-06-22: 400 mg via ORAL

## 2023-06-21 MED ORDER — TRANEXAMIC ACID-NACL 1000-0.7 MG/100ML-% IV SOLN
1000.0000 mg | INTRAVENOUS | Status: AC
  Administered 2023-06-22: 1000 mg via INTRAVENOUS

## 2023-06-21 MED ORDER — CEFAZOLIN SODIUM-DEXTROSE 2-4 GM/100ML-% IV SOLN
2.0000 g | INTRAVENOUS | Status: AC
  Administered 2023-06-22: 2 g via INTRAVENOUS

## 2023-06-21 MED ORDER — GABAPENTIN 300 MG PO CAPS
300.0000 mg | ORAL_CAPSULE | Freq: Once | ORAL | Status: AC
  Administered 2023-06-22: 300 mg via ORAL

## 2023-06-21 MED ORDER — LACTATED RINGERS IV SOLN: INTRAVENOUS | Status: DC

## 2023-06-22 ENCOUNTER — Encounter
Admission: RE | Disposition: A | Discharge status: Home Health | Payer: Self-pay | Source: Ambulatory Visit | Attending: Orthopedic Surgery

## 2023-06-22 ENCOUNTER — Ambulatory Visit: Payer: Medicare HMO | Admitting: Certified Registered Nurse Anesthetist

## 2023-06-22 ENCOUNTER — Encounter: Payer: Self-pay | Admitting: Orthopedic Surgery

## 2023-06-22 ENCOUNTER — Other Ambulatory Visit: Payer: Self-pay

## 2023-06-22 ENCOUNTER — Observation Stay
Admission: RE | Admit: 2023-06-22 | Discharge: 2023-06-23 | Disposition: A | Discharge status: Home Health | Payer: Medicare HMO | Source: Ambulatory Visit | Attending: Orthopedic Surgery | Admitting: Orthopedic Surgery

## 2023-06-22 ENCOUNTER — Observation Stay: Payer: Medicare HMO

## 2023-06-22 ENCOUNTER — Ambulatory Visit: Payer: Medicare HMO | Admitting: Urgent Care

## 2023-06-22 DIAGNOSIS — Z86718 Personal history of other venous thrombosis and embolism: Secondary | ICD-10-CM | POA: Diagnosis not present

## 2023-06-22 DIAGNOSIS — M1712 Unilateral primary osteoarthritis, left knee: Principal | ICD-10-CM | POA: Insufficient documentation

## 2023-06-22 DIAGNOSIS — Z853 Personal history of malignant neoplasm of breast: Secondary | ICD-10-CM | POA: Diagnosis not present

## 2023-06-22 DIAGNOSIS — Z79899 Other long term (current) drug therapy: Secondary | ICD-10-CM | POA: Insufficient documentation

## 2023-06-22 DIAGNOSIS — Z85828 Personal history of other malignant neoplasm of skin: Secondary | ICD-10-CM | POA: Insufficient documentation

## 2023-06-22 DIAGNOSIS — Z87891 Personal history of nicotine dependence: Secondary | ICD-10-CM | POA: Insufficient documentation

## 2023-06-22 DIAGNOSIS — Z96659 Presence of unspecified artificial knee joint: Secondary | ICD-10-CM

## 2023-06-22 DIAGNOSIS — I10 Essential (primary) hypertension: Secondary | ICD-10-CM | POA: Diagnosis not present

## 2023-06-22 DIAGNOSIS — Z96651 Presence of right artificial knee joint: Secondary | ICD-10-CM | POA: Diagnosis not present

## 2023-06-22 DIAGNOSIS — Z01812 Encounter for preprocedural laboratory examination: Secondary | ICD-10-CM

## 2023-06-22 HISTORY — PX: KNEE ARTHROPLASTY: SHX992

## 2023-06-22 LAB — ABO/RH: ABO/RH(D): O POS

## 2023-06-22 SURGERY — ARTHROPLASTY, KNEE, TOTAL, USING IMAGELESS COMPUTER-ASSISTED NAVIGATION
Anesthesia: Spinal | Site: Knee | Laterality: Left

## 2023-06-22 MED ORDER — ACETAMINOPHEN 10 MG/ML IV SOLN
INTRAVENOUS | Status: AC
  Filled 2023-06-22: qty 100

## 2023-06-22 MED ORDER — SODIUM CHLORIDE FLUSH 0.9 % IV SOLN
INTRAVENOUS | Status: AC
  Filled 2023-06-22: qty 40

## 2023-06-22 MED ORDER — MIDAZOLAM HCL 2 MG/2ML IJ SOLN
INTRAMUSCULAR | Status: AC
  Filled 2023-06-22: qty 2

## 2023-06-22 MED ORDER — PROPOFOL 500 MG/50ML IV EMUL
INTRAVENOUS | Status: DC | PRN
  Administered 2023-06-22: 75 ug/kg/min via INTRAVENOUS

## 2023-06-22 MED ORDER — ENSURE PRE-SURGERY PO LIQD
296.0000 mL | Freq: Once | ORAL | Status: AC
  Administered 2023-06-22: 296 mL via ORAL
  Filled 2023-06-22: qty 296

## 2023-06-22 MED ORDER — LISINOPRIL 10 MG PO TABS
10.0000 mg | ORAL_TABLET | Freq: Every day | ORAL | Status: DC
  Administered 2023-06-23: 10 mg via ORAL
  Filled 2023-06-22: qty 1

## 2023-06-22 MED ORDER — PHENYLEPHRINE HCL-NACL 20-0.9 MG/250ML-% IV SOLN
INTRAVENOUS | Status: DC | PRN
  Administered 2023-06-22: 25 ug/min via INTRAVENOUS

## 2023-06-22 MED ORDER — TRAMADOL HCL 50 MG PO TABS: 50.0000 mg | ORAL_TABLET | ORAL | Status: DC | PRN

## 2023-06-22 MED ORDER — PHENOL 1.4 % MT LIQD: 1.0000 | OROMUCOSAL | Status: DC | PRN

## 2023-06-22 MED ORDER — FENTANYL CITRATE (PF) 100 MCG/2ML IJ SOLN
25.0000 ug | INTRAMUSCULAR | Status: DC | PRN
  Administered 2023-06-22: 25 ug via INTRAVENOUS

## 2023-06-22 MED ORDER — ACETAMINOPHEN 10 MG/ML IV SOLN
INTRAVENOUS | Status: DC | PRN
  Administered 2023-06-22: 1000 mg via INTRAVENOUS

## 2023-06-22 MED ORDER — MENTHOL 3 MG MT LOZG: 1.0000 | LOZENGE | OROMUCOSAL | Status: DC | PRN

## 2023-06-22 MED ORDER — PROPOFOL 1000 MG/100ML IV EMUL
INTRAVENOUS | Status: AC
  Filled 2023-06-22: qty 100

## 2023-06-22 MED ORDER — CHLORHEXIDINE GLUCONATE 0.12 % MT SOLN
OROMUCOSAL | Status: AC
  Filled 2023-06-22: qty 15

## 2023-06-22 MED ORDER — GABAPENTIN 100 MG PO CAPS
200.0000 mg | ORAL_CAPSULE | Freq: Three times a day (TID) | ORAL | Status: DC
  Administered 2023-06-22 – 2023-06-23 (×2): 200 mg via ORAL
  Filled 2023-06-22 (×2): qty 2

## 2023-06-22 MED ORDER — NYSTATIN 100000 UNIT/GM EX POWD: 1.0000 | Freq: Two times a day (BID) | CUTANEOUS | Status: DC | PRN

## 2023-06-22 MED ORDER — LETROZOLE 2.5 MG PO TABS
2.5000 mg | ORAL_TABLET | Freq: Every day | ORAL | Status: DC
  Administered 2023-06-23: 2.5 mg via ORAL
  Filled 2023-06-22: qty 1

## 2023-06-22 MED ORDER — ONDANSETRON HCL 4 MG/2ML IJ SOLN
INTRAMUSCULAR | Status: DC | PRN
  Administered 2023-06-22: 4 mg via INTRAVENOUS

## 2023-06-22 MED ORDER — SODIUM CHLORIDE 0.9 % IV SOLN: INTRAVENOUS | Status: DC

## 2023-06-22 MED ORDER — BISACODYL 10 MG RE SUPP: 10.0000 mg | Freq: Every day | RECTAL | Status: DC | PRN

## 2023-06-22 MED ORDER — FENTANYL CITRATE (PF) 100 MCG/2ML IJ SOLN
INTRAMUSCULAR | Status: AC
  Filled 2023-06-22: qty 2

## 2023-06-22 MED ORDER — CEFAZOLIN SODIUM-DEXTROSE 2-4 GM/100ML-% IV SOLN
INTRAVENOUS | Status: AC
  Filled 2023-06-22: qty 100

## 2023-06-22 MED ORDER — TRANEXAMIC ACID-NACL 1000-0.7 MG/100ML-% IV SOLN
1000.0000 mg | Freq: Once | INTRAVENOUS | Status: AC
  Administered 2023-06-22: 1000 mg via INTRAVENOUS

## 2023-06-22 MED ORDER — POTASSIUM GLUCONATE 595 (99 K) MG PO TABS
595.0000 mg | ORAL_TABLET | Freq: Every day | ORAL | Status: DC
  Filled 2023-06-22 (×2): qty 1

## 2023-06-22 MED ORDER — GLYCOPYRROLATE 0.2 MG/ML IJ SOLN
INTRAMUSCULAR | Status: DC | PRN
Start: 2023-06-22 — End: 2023-06-22
  Administered 2023-06-22: .2 mg via INTRAVENOUS

## 2023-06-22 MED ORDER — MAGNESIUM HYDROXIDE 400 MG/5ML PO SUSP
30.0000 mL | Freq: Every day | ORAL | Status: DC
  Administered 2023-06-22 – 2023-06-23 (×2): 30 mL via ORAL
  Filled 2023-06-22 (×2): qty 30

## 2023-06-22 MED ORDER — BUPIVACAINE HCL (PF) 0.25 % IJ SOLN
INTRAMUSCULAR | Status: AC
  Filled 2023-06-22: qty 60

## 2023-06-22 MED ORDER — SENNOSIDES-DOCUSATE SODIUM 8.6-50 MG PO TABS
1.0000 | ORAL_TABLET | Freq: Two times a day (BID) | ORAL | Status: DC
  Administered 2023-06-22 – 2023-06-23 (×2): 1 via ORAL
  Filled 2023-06-22 (×2): qty 1

## 2023-06-22 MED ORDER — FENTANYL CITRATE (PF) 100 MCG/2ML IJ SOLN
INTRAMUSCULAR | Status: DC | PRN
  Administered 2023-06-22 (×4): 25 ug via INTRAVENOUS

## 2023-06-22 MED ORDER — BUPIVACAINE HCL (PF) 0.5 % IJ SOLN
INTRAMUSCULAR | Status: AC
  Filled 2023-06-22: qty 10

## 2023-06-22 MED ORDER — METOCLOPRAMIDE HCL 5 MG PO TABS
10.0000 mg | ORAL_TABLET | Freq: Three times a day (TID) | ORAL | Status: DC
  Administered 2023-06-22 – 2023-06-23 (×3): 10 mg via ORAL
  Filled 2023-06-22 (×3): qty 2

## 2023-06-22 MED ORDER — ALUM & MAG HYDROXIDE-SIMETH 200-200-20 MG/5ML PO SUSP: 30.0000 mL | ORAL | Status: DC | PRN

## 2023-06-22 MED ORDER — PSYLLIUM 95 % PO PACK
1.0000 | PACK | Freq: Every day | ORAL | Status: DC
  Administered 2023-06-23: 1 via ORAL
  Filled 2023-06-22: qty 1

## 2023-06-22 MED ORDER — CEFAZOLIN SODIUM-DEXTROSE 2-4 GM/100ML-% IV SOLN
2.0000 g | Freq: Four times a day (QID) | INTRAVENOUS | Status: AC
  Administered 2023-06-22 (×2): 2 g via INTRAVENOUS
  Filled 2023-06-22 (×2): qty 100

## 2023-06-22 MED ORDER — ASPIRIN 81 MG PO CHEW
81.0000 mg | CHEWABLE_TABLET | Freq: Two times a day (BID) | ORAL | Status: DC
  Administered 2023-06-22 – 2023-06-23 (×2): 81 mg via ORAL
  Filled 2023-06-22 (×2): qty 1

## 2023-06-22 MED ORDER — TRANEXAMIC ACID-NACL 1000-0.7 MG/100ML-% IV SOLN
INTRAVENOUS | Status: AC
  Filled 2023-06-22: qty 100

## 2023-06-22 MED ORDER — FERROUS SULFATE 325 (65 FE) MG PO TABS
325.0000 mg | ORAL_TABLET | Freq: Two times a day (BID) | ORAL | Status: DC
  Administered 2023-06-22 – 2023-06-23 (×2): 325 mg via ORAL
  Filled 2023-06-22 (×2): qty 1

## 2023-06-22 MED ORDER — SODIUM CHLORIDE (PF) 0.9 % IJ SOLN
INTRAMUSCULAR | Status: DC | PRN
  Administered 2023-06-22: 120 mL via INTRAMUSCULAR

## 2023-06-22 MED ORDER — CELECOXIB 200 MG PO CAPS
200.0000 mg | ORAL_CAPSULE | Freq: Two times a day (BID) | ORAL | Status: DC
  Administered 2023-06-22 – 2023-06-23 (×2): 200 mg via ORAL
  Filled 2023-06-22 (×2): qty 1

## 2023-06-22 MED ORDER — OXYCODONE HCL 5 MG PO TABS
5.0000 mg | ORAL_TABLET | ORAL | Status: DC | PRN
  Administered 2023-06-23: 5 mg via ORAL
  Filled 2023-06-22: qty 1

## 2023-06-22 MED ORDER — SODIUM CHLORIDE 0.9 % IR SOLN
Status: DC | PRN
  Administered 2023-06-22: 3000 mL

## 2023-06-22 MED ORDER — ONDANSETRON HCL 4 MG/2ML IJ SOLN: 4.0000 mg | Freq: Once | INTRAMUSCULAR | Status: DC | PRN

## 2023-06-22 MED ORDER — SIMVASTATIN 20 MG PO TABS
20.0000 mg | ORAL_TABLET | Freq: Every day | ORAL | Status: DC
  Administered 2023-06-23: 20 mg via ORAL
  Filled 2023-06-22: qty 1

## 2023-06-22 MED ORDER — ONDANSETRON HCL 4 MG PO TABS: 4.0000 mg | ORAL_TABLET | Freq: Four times a day (QID) | ORAL | Status: DC | PRN

## 2023-06-22 MED ORDER — ACETAMINOPHEN 325 MG PO TABS: 325.0000 mg | ORAL_TABLET | Freq: Four times a day (QID) | ORAL | Status: DC | PRN

## 2023-06-22 MED ORDER — BUPIVACAINE LIPOSOME 1.3 % IJ SUSP
INTRAMUSCULAR | Status: AC
  Filled 2023-06-22: qty 20

## 2023-06-22 MED ORDER — MIDAZOLAM HCL 5 MG/5ML IJ SOLN
INTRAMUSCULAR | Status: DC | PRN
  Administered 2023-06-22: 2 mg via INTRAVENOUS

## 2023-06-22 MED ORDER — SURGIPHOR WOUND IRRIGATION SYSTEM - OPTIME: TOPICAL | Status: DC | PRN

## 2023-06-22 MED ORDER — PANTOPRAZOLE SODIUM 40 MG PO TBEC
40.0000 mg | DELAYED_RELEASE_TABLET | Freq: Two times a day (BID) | ORAL | Status: DC
  Administered 2023-06-22 – 2023-06-23 (×2): 40 mg via ORAL
  Filled 2023-06-22 (×2): qty 1

## 2023-06-22 MED ORDER — DEXAMETHASONE SODIUM PHOSPHATE 10 MG/ML IJ SOLN
INTRAMUSCULAR | Status: AC
  Filled 2023-06-22: qty 1

## 2023-06-22 MED ORDER — OXYCODONE HCL 5 MG PO TABS
10.0000 mg | ORAL_TABLET | ORAL | Status: DC | PRN
  Administered 2023-06-22 – 2023-06-23 (×2): 10 mg via ORAL
  Filled 2023-06-22 (×2): qty 2

## 2023-06-22 MED ORDER — ACETAMINOPHEN 10 MG/ML IV SOLN
1000.0000 mg | Freq: Four times a day (QID) | INTRAVENOUS | Status: DC
  Administered 2023-06-22 – 2023-06-23 (×2): 1000 mg via INTRAVENOUS
  Filled 2023-06-22 (×3): qty 100

## 2023-06-22 MED ORDER — GABAPENTIN 300 MG PO CAPS
ORAL_CAPSULE | ORAL | Status: AC
  Filled 2023-06-22: qty 1

## 2023-06-22 MED ORDER — ONDANSETRON HCL 4 MG/2ML IJ SOLN: 4.0000 mg | Freq: Four times a day (QID) | INTRAMUSCULAR | Status: DC | PRN

## 2023-06-22 MED ORDER — DIPHENHYDRAMINE HCL 12.5 MG/5ML PO ELIX: 12.5000 mg | ORAL_SOLUTION | ORAL | Status: DC | PRN

## 2023-06-22 MED ORDER — HYDROMORPHONE HCL 1 MG/ML IJ SOLN: 0.5000 mg | INTRAMUSCULAR | Status: DC | PRN

## 2023-06-22 MED ORDER — CELECOXIB 200 MG PO CAPS
ORAL_CAPSULE | ORAL | Status: AC
  Filled 2023-06-22: qty 2

## 2023-06-22 MED ORDER — BUPIVACAINE HCL (PF) 0.5 % IJ SOLN
INTRAMUSCULAR | Status: DC | PRN
  Administered 2023-06-22: 2.65 mL

## 2023-06-22 MED ORDER — FLEET ENEMA RE ENEM: 1.0000 | ENEMA | Freq: Once | RECTAL | Status: DC | PRN

## 2023-06-22 MED ORDER — MAGNESIUM OXIDE -MG SUPPLEMENT 400 (240 MG) MG PO TABS
400.0000 mg | ORAL_TABLET | Freq: Every day | ORAL | Status: DC
  Administered 2023-06-23: 400 mg via ORAL
  Filled 2023-06-22: qty 1

## 2023-06-22 SURGICAL SUPPLY — 78 items
ATTUNE PS FEM LT SZ 4 CEM KNEE (Femur) IMPLANT
ATTUNE PSRP INSR SZ4 10 KNEE (Insert) IMPLANT
BASE TIBIAL ROT PLAT SZ 5 KNEE (Knees) IMPLANT
BATTERY INSTRU NAVIGATION (MISCELLANEOUS) ×4 IMPLANT
BIT DRILL QUICK REL 1/8 2PK SL (BIT) ×1 IMPLANT
BLADE SAW 70X12.5 (BLADE) ×1 IMPLANT
BLADE SAW 90X13X1.19 OSCILLAT (BLADE) ×1 IMPLANT
BLADE SAW 90X25X1.19 OSCILLAT (BLADE) ×1 IMPLANT
BONE CEMENT GENTAMICIN (Cement) ×3 IMPLANT
BRUSH SCRUB EZ PLAIN DRY (MISCELLANEOUS) ×1 IMPLANT
BSPLAT TIB 5 CMNT ROT PLAT STR (Knees) ×1 IMPLANT
BTRY SRG DRVR LF (MISCELLANEOUS) ×4
CEMENT BONE GENTAMICIN 40 (Cement) IMPLANT
COOLER POLAR GLACIER W/PUMP (MISCELLANEOUS) ×1 IMPLANT
CUFF TOURN SGL QUICK 24 (TOURNIQUET CUFF) ×1
CUFF TOURN SGL QUICK 30 (TOURNIQUET CUFF)
CUFF TRNQT CYL 24X4X16.5-23 (TOURNIQUET CUFF) IMPLANT
CUFF TRNQT CYL 30X4X21-28X (TOURNIQUET CUFF) IMPLANT
DRAPE INCISE IOBAN 66X45 STRL (DRAPES) IMPLANT
DRAPE SHEET LG 3/4 BI-LAMINATE (DRAPES) ×1 IMPLANT
DRSG AQUACEL AG ADV 3.5X14 (GAUZE/BANDAGES/DRESSINGS) ×1 IMPLANT
DRSG MEPILEX SACRM 8.7X9.8 (GAUZE/BANDAGES/DRESSINGS) ×1 IMPLANT
DRSG TEGADERM 4X4.75 (GAUZE/BANDAGES/DRESSINGS) ×1 IMPLANT
DURAPREP 26ML APPLICATOR (WOUND CARE) ×2 IMPLANT
ELECT CAUTERY BLADE 6.4 (BLADE) ×1 IMPLANT
ELECT REM PT RETURN 9FT ADLT (ELECTROSURGICAL) ×1
ELECTRODE REM PT RTRN 9FT ADLT (ELECTROSURGICAL) ×1 IMPLANT
EVACUATOR 1/8 PVC DRAIN (DRAIN) ×1 IMPLANT
EX-PIN ORTHOLOCK NAV 4X150 (PIN) ×2 IMPLANT
GAUZE XEROFORM 1X8 LF (GAUZE/BANDAGES/DRESSINGS) ×1 IMPLANT
GLOVE BIOGEL M STRL SZ7.5 (GLOVE) ×4 IMPLANT
GLOVE SRG 8 PF TXTR STRL LF DI (GLOVE) ×2 IMPLANT
GLOVE SURG UNDER POLY LF SZ8 (GLOVE) ×1
GOWN STRL REUS W/ TWL LRG LVL3 (GOWN DISPOSABLE) ×1 IMPLANT
GOWN STRL REUS W/ TWL XL LVL3 (GOWN DISPOSABLE) ×1 IMPLANT
GOWN STRL REUS W/TWL LRG LVL3 (GOWN DISPOSABLE) ×1
GOWN STRL REUS W/TWL XL LVL3 (GOWN DISPOSABLE) ×1
GOWN TOGA ZIPPER T7+ PEEL AWAY (MISCELLANEOUS) ×1 IMPLANT
HANDLE YANKAUER SUCT OPEN TIP (MISCELLANEOUS) ×1 IMPLANT
HOLDER FOLEY CATH W/STRAP (MISCELLANEOUS) ×1 IMPLANT
HOOD PEEL AWAY T7 (MISCELLANEOUS) ×1 IMPLANT
IV NS IRRIG 3000ML ARTHROMATIC (IV SOLUTION) ×1 IMPLANT
KIT TURNOVER KIT A (KITS) ×1 IMPLANT
KNIFE SCULPS 14X20 (INSTRUMENTS) ×1 IMPLANT
MANIFOLD NEPTUNE II (INSTRUMENTS) ×2 IMPLANT
NDL SPNL 20GX3.5 QUINCKE YW (NEEDLE) ×2 IMPLANT
NEEDLE SPNL 20GX3.5 QUINCKE YW (NEEDLE) ×2
PACK TOTAL KNEE (MISCELLANEOUS) ×1 IMPLANT
PAD ABD DERMACEA PRESS 5X9 (GAUZE/BANDAGES/DRESSINGS) ×2 IMPLANT
PAD ARMBOARD 7.5X6 YLW CONV (MISCELLANEOUS) ×3 IMPLANT
PAD WRAPON POLAR KNEE (MISCELLANEOUS) ×1 IMPLANT
PATELLA MEDIAL ATTUN 35MM KNEE (Knees) IMPLANT
PENCIL SMOKE EVACUATOR COATED (MISCELLANEOUS) ×1 IMPLANT
PIN DRILL FIX HALF THREAD (BIT) ×2 IMPLANT
PIN FIXATION 1/8DIA X 3INL (PIN) ×1 IMPLANT
PULSAVAC PLUS IRRIG FAN TIP (DISPOSABLE) ×1
SOL PREP PVP 2OZ (MISCELLANEOUS) ×1
SOLUTION IRRIG SURGIPHOR (IV SOLUTION) ×1 IMPLANT
SOLUTION PREP PVP 2OZ (MISCELLANEOUS) ×1 IMPLANT
SPONGE DRAIN TRACH 4X4 STRL 2S (GAUZE/BANDAGES/DRESSINGS) ×1 IMPLANT
SPONGE T-LAP 18X18 ~~LOC~~+RFID (SPONGE) IMPLANT
STAPLER SKIN PROX 35W (STAPLE) ×1 IMPLANT
STOCKINETTE IMPERV 14X48 (MISCELLANEOUS) ×1 IMPLANT
STRAP TIBIA SHORT (MISCELLANEOUS) ×1 IMPLANT
SUCTION TUBE FRAZIER 10FR DISP (SUCTIONS) ×1 IMPLANT
SUT VIC AB 0 CT1 36 (SUTURE) ×1 IMPLANT
SUT VIC AB 1 CT1 36 (SUTURE) ×2 IMPLANT
SUT VIC AB 2-0 CT2 27 (SUTURE) ×1 IMPLANT
SYR 30ML LL (SYRINGE) ×2 IMPLANT
TIBIAL BASE ROT PLAT SZ 5 KNEE (Knees) ×1 IMPLANT
TIP FAN IRRIG PULSAVAC PLUS (DISPOSABLE) ×1 IMPLANT
TOWEL OR 17X26 4PK STRL BLUE (TOWEL DISPOSABLE) IMPLANT
TOWER CARTRIDGE SMART MIX (DISPOSABLE) ×1 IMPLANT
TRAP FLUID SMOKE EVACUATOR (MISCELLANEOUS) ×1 IMPLANT
TRAY FOLEY MTR SLVR 16FR STAT (SET/KITS/TRAYS/PACK) ×1 IMPLANT
TUBING CONNECTING 10 (TUBING) ×2 IMPLANT
WATER STERILE IRR 1000ML POUR (IV SOLUTION) ×1 IMPLANT
WRAPON POLAR PAD KNEE (MISCELLANEOUS) ×1

## 2023-06-22 NOTE — Interval H&P Note (Signed)
History and Physical Interval Note:  06/22/2023 10:43 AM  Mariah Hanson  has presented today for surgery, with the diagnosis of PRIMARY OSTEOARTHRITIS OF LEFT KNEE..  The various methods of treatment have been discussed with the patient and family. After consideration of risks, benefits and other options for treatment, the patient has consented to  Procedure(s): COMPUTER ASSISTED TOTAL KNEE ARTHROPLASTY - RNFA (Left) as a surgical intervention.  The patient's history has been reviewed, patient examined, no change in status, stable for surgery.  I have reviewed the patient's chart and labs.  Questions were answered to the patient's satisfaction.     Jacson Rapaport P Carroll Lingelbach

## 2023-06-22 NOTE — Transfer of Care (Signed)
Immediate Anesthesia Transfer of Care Note  Patient: Mariah Hanson  Procedure(s) Performed: COMPUTER ASSISTED TOTAL KNEE ARTHROPLASTY - RNFA (Left: Knee)  Patient Location: PACU  Anesthesia Type:Spinal  Level of Consciousness: drowsy  Airway & Oxygen Therapy: Patient Spontanous Breathing and Patient connected to face mask oxygen  Post-op Assessment: Report given to RN and Post -op Vital signs reviewed and stable  Post vital signs: Reviewed and stable  Last Vitals:  Vitals Value Taken Time  BP 110/81 06/22/23 1509  Temp 36.3 C 06/22/23 1509  Pulse 103 06/22/23 1511  Resp 13 06/22/23 1511  SpO2 97 % 06/22/23 1511  Vitals shown include unfiled device data.  Last Pain:  Vitals:   06/22/23 0931  PainSc: 8          Complications: No notable events documented.

## 2023-06-22 NOTE — Plan of Care (Signed)
  Problem: Education: Goal: Knowledge of the prescribed therapeutic regimen will improve Outcome: Progressing Goal: Individualized Educational Video(s) Outcome: Progressing   Problem: Skin Integrity: Goal: Will show signs of wound healing Outcome: Progressing   Problem: Pain Management: Goal: Pain level will decrease with appropriate interventions Outcome: Progressing   Problem: Clinical Measurements: Goal: Ability to maintain clinical measurements within normal limits will improve Outcome: Progressing Goal: Will remain free from infection Outcome: Progressing Goal: Diagnostic test results will improve Outcome: Progressing Goal: Respiratory complications will improve Outcome: Progressing Goal: Cardiovascular complication will be avoided Outcome: Progressing

## 2023-06-22 NOTE — Anesthesia Preprocedure Evaluation (Signed)
Anesthesia Evaluation  Patient identified by MRN, date of birth, ID band Patient awake    Reviewed: Allergy & Precautions, H&P , NPO status , Patient's Chart, lab work & pertinent test results, reviewed documented beta blocker date and time   History of Anesthesia Complications (+) PONV and history of anesthetic complications  Airway Mallampati: II  TM Distance: >3 FB Neck ROM: full    Dental  (+) Dental Advidsory Given, Caps, Teeth Intact   Pulmonary neg shortness of breath, sleep apnea , neg COPD, neg recent URI, former smoker   Pulmonary exam normal breath sounds clear to auscultation       Cardiovascular Exercise Tolerance: Good hypertension, (-) angina (-) Past MI and (-) Cardiac Stents Normal cardiovascular exam(-) dysrhythmias (-) Valvular Problems/Murmurs Rhythm:regular Rate:Normal     Neuro/Psych negative neurological ROS  negative psych ROS   GI/Hepatic Neg liver ROS,GERD  ,,  Endo/Other  negative endocrine ROS    Renal/GU negative Renal ROS  negative genitourinary   Musculoskeletal   Abdominal   Peds  Hematology negative hematology ROS (+)   Anesthesia Other Findings Past Medical History: No date: Basal cell adenocarcinoma     Comment:  chin, right leg 10/2021: Breast cancer, right breast (HCC) 2019: Cellulitis of right knee 2005: Cholecystitis 2015: Diverticulitis 08/1960: Fracture, humerus closed     Comment:  left 1968: Fracture, tibia     Comment:  left No date: GERD (gastroesophageal reflux disease) No date: Hypercholesteremia No date: Hypertension, essential No date: Moderate obstructive sleep apnea 2023: Neuropathy due to chemotherapeutic drug (HCC) No date: Osteoarthritis 2013: Osteoporosis No date: PONV (postoperative nausea and vomiting) 2020: Squamous cell cancer of skin of right hand No date: 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 No date: Thrombosis of ovarian vein No date:  Vitamin B 12 deficiency No date: Vitamin D deficiency   Reproductive/Obstetrics negative OB ROS                             Anesthesia Physical Anesthesia Plan  ASA: 2  Anesthesia Plan: Spinal   Post-op Pain Management:    Induction: Intravenous  PONV Risk Score and Plan: 3 and Propofol infusion, TIVA and Treatment may vary due to age or medical condition  Airway Management Planned: Natural Airway and Simple Face Mask  Additional Equipment:   Intra-op Plan:   Post-operative Plan:   Informed Consent: I have reviewed the patients History and Physical, chart, labs and discussed the procedure including the risks, benefits and alternatives for the proposed anesthesia with the patient or authorized representative who has indicated his/her understanding and acceptance.     Dental Advisory Given  Plan Discussed with: Anesthesiologist, CRNA and Surgeon  Anesthesia Plan Comments:        Anesthesia Quick Evaluation

## 2023-06-22 NOTE — Anesthesia Procedure Notes (Signed)
Date/Time: 06/22/2023 11:51 AM  Performed by: Malva Cogan, CRNAPre-anesthesia Checklist: Patient identified, Emergency Drugs available, Suction available, Patient being monitored and Timeout performed Patient Re-evaluated:Patient Re-evaluated prior to induction Oxygen Delivery Method: Simple face mask Induction Type: IV induction Ventilation: Oral airway inserted - appropriate to patient size Placement Confirmation: CO2 detector and positive ETCO2

## 2023-06-22 NOTE — Anesthesia Procedure Notes (Signed)
Spinal  Patient location during procedure: OR Start time: 06/22/2023 11:18 AM End time: 06/22/2023 11:21 AM Reason for block: surgical anesthesia Staffing Performed: resident/CRNA  Anesthesiologist: Lenard Simmer, MD Resident/CRNA: Karoline Caldwell, CRNA Performed by: Karoline Caldwell, CRNA Authorized by: Lenard Simmer, MD   Preanesthetic Checklist Completed: patient identified, IV checked, site marked, risks and benefits discussed, surgical consent, monitors and equipment checked, pre-op evaluation and timeout performed Spinal Block Patient position: sitting Prep: ChloraPrep Patient monitoring: heart rate, continuous pulse ox, blood pressure and cardiac monitor Approach: midline Location: L3-4 Injection technique: single-shot Needle Needle type: Whitacre and Introducer  Needle gauge: 24 G Needle length: 9 cm Assessment Events: CSF return Additional Notes Sterile aseptic technique used throughout the procedure.  Negative paresthesia. Negative blood return. Positive free-flowing CSF. Expiration date of kit checked and confirmed. Patient tolerated procedure well, without complications.

## 2023-06-22 NOTE — Progress Notes (Signed)
Patient is not able to walk the distance required to go the bathroom, or he/she is unable to safely negotiate stairs required to access the bathroom.  A 3in1 BSC will alleviate this problem   Mariah Hanson, Jr. M.D.  

## 2023-06-22 NOTE — Discharge Summary (Signed)
Physician Discharge Summary  Patient ID: Mariah Hanson MRN: [id number] DOB/AGE: 1955/10/28 67 y.o.  Admit date: 06/22/2023 Discharge date: 06/23/2023  Admission Diagnoses:  Total knee replacement status [Z96.659] Primary osteoarthritis of the left knee  Discharge Diagnoses: Patient Active Problem List   Diagnosis Date Noted   Total knee replacement status 06/22/2023   B12 deficiency 12/07/2022   Low serum potassium 12/07/2022   Peripheral neuropathy due to chemotherapy (HCC) 12/07/2022   Prediabetes 12/07/2022   Thrombocytopenia (HCC) 12/07/2022   Broken or cracked tooth, nontraumatic 12/23/2021   Mucositis due to chemotherapy 12/23/2021   Chronic right shoulder pain 10/27/2021   Obesity (BMI 30.0-34.9) 10/27/2021   Status post rotator cuff repair 10/27/2021   Breast cancer, right breast (HCC) 10/26/2021   Personal history of other malignant neoplasm of skin 01/18/2021   Osteopenia 04/08/2019   History of total right knee replacement 05/20/2018   Primary osteoarthritis of left knee 07/05/2017   Mixed dyslipidemia 03/11/2017   Benign paroxysmal positional vertigo of left ear 06/16/2016   Sensorineural hearing loss of both ears 06/16/2016   Mixed incontinence 03/10/2016   Hypercholesteremia 03/10/2016   Osteoporosis, post-menopausal 02/03/2015   OSA on CPAP 03/10/2014   Tubular adenoma 02/11/2013   Bilateral knee pain 12/10/2012   Osteoporosis 04/11/2012   Vitamin D deficiency 04/11/2012   Essential hypertension 02/09/2012   Basal cell carcinoma of other specified sites of skin 11/30/2011   GERD (gastroesophageal reflux disease) 11/30/2011   Tinnitus, bilateral 11/30/2011    Past Medical History:  Diagnosis Date   Basal cell adenocarcinoma    chin, right leg   Breast cancer, right breast (HCC) 10/2021   Cellulitis of right knee 2019   Cholecystitis 2005   Diverticulitis 2015   Fracture, humerus closed 08/1960   left   Fracture, tibia 1968   left   GERD  (gastroesophageal reflux disease)    Hypercholesteremia    Hypertension, essential    Moderate obstructive sleep apnea    Neuropathy due to chemotherapeutic drug (HCC) 2023   Osteoarthritis    Osteoporosis 2013   PONV (postoperative nausea and vomiting)    Squamous cell cancer of skin of right hand 2020   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    Thrombosis of ovarian vein    Vitamin B 12 deficiency    Vitamin D deficiency      Transfusion: None.   Consultants (if any):   Discharged Condition: Improved  Hospital Course: Mariah Hanson is an 67 y.o. female who was admitted 06/22/2023 with a diagnosis of primary osteoarthritis of the left knee and went to the operating room on 06/22/2023 and underwent the above named procedures.    Surgeries: Procedure(s): COMPUTER ASSISTED TOTAL KNEE ARTHROPLASTY - RNFA on 06/22/2023 Patient tolerated the surgery well. Taken to PACU where she was stabilized and then transferred to the orthopedic floor.  Started on aspirin 81mg  BID.  Heels elevated on bed with rolled towels. No evidence of DVT. Negative Homan.  Physical therapy started on day #0 for gait training and transfer. OT started day #1 for ADL and assisted devices.  Patient's IV and hemovac were removed on POD1.  Foley was removed shortly after surgery.  Implants: DePuy Attune size 4 posterior stabilized femoral component (cemented), size 5 rotating platform tibial component (cemented), 35 mm medialized dome patella (cemented), and a 10 mm stabilized rotating platform polyethylene insert.   She was given perioperative antibiotics:  Anti-infectives (From admission, onward)    Start  Dose/Rate Route Frequency Ordered Stop   06/22/23 1800  ceFAZolin (ANCEF) IVPB 2g/100 mL premix        2 g 200 mL/hr over 30 Minutes Intravenous Every 6 hours 06/22/23 1656 06/23/23 0017   06/22/23 0600  ceFAZolin (ANCEF) IVPB 2g/100 mL premix        2 g 200 mL/hr over 30 Minutes Intravenous On call to  O.R. 06/21/23 2318 06/22/23 1143     .  She was given sequential compression devices, early ambulation, and aspirin for DVT prophylaxis.  She benefited maximally from the hospital stay and there were no complications.    Recent vital signs:  Vitals:   06/23/23 0000 06/23/23 0541  BP: 100/64 123/72  Pulse: 81 75  Resp: 18 16  Temp: (!) 97.3 F (36.3 C) 97.9 F (36.6 C)  SpO2: 95% 93%    Recent laboratory studies:  Lab Results  Component Value Date   HGB 14.1 06/18/2023   Lab Results  Component Value Date   WBC 5.5 06/18/2023   PLT 122 (L) 06/18/2023   No results found for: "INR" Lab Results  Component Value Date   NA 138 06/18/2023   K 4.0 06/18/2023   CL 102 06/18/2023   CO2 24 06/18/2023   BUN 19 06/18/2023   CREATININE 0.65 06/18/2023   GLUCOSE 127 (H) 06/18/2023    Discharge Medications:   Allergies as of 06/23/2023       Reactions   Codeine Nausea And Vomiting   Hydrocodone Nausea And Vomiting   GI UPSET   Venlafaxine    Other reaction(s): Headache Fatigue        Medication List     STOP taking these medications    acetaminophen 650 MG CR tablet Commonly known as: TYLENOL Replaced by: acetaminophen 500 MG tablet       TAKE these medications    acetaminophen 500 MG tablet Commonly known as: TYLENOL Take 2 tablets (1,000 mg total) by mouth every 6 (six) hours as needed. Replaces: acetaminophen 650 MG CR tablet   aspirin 81 MG chewable tablet Chew 1 tablet (81 mg total) by mouth 2 (two) times daily.   celecoxib 200 MG capsule Commonly known as: CELEBREX Take 1 capsule (200 mg total) by mouth 2 (two) times daily.   [facility] Potassium Gluconate 595 (99 K) MG Tabs tablet Generic drug: potassium gluconate Take 1 tablet by mouth daily.   denosumab 60 MG/ML Sosy injection Commonly known as: PROLIA Inject 60 mg into the skin every 6 (six) months.   famotidine 20 MG tablet Commonly known as: PEPCID Take 20 mg by mouth 2 (two) times daily  as needed.   gabapentin 100 MG capsule Commonly known as: NEURONTIN Take 200 mg by mouth 3 (three) times daily.   letrozole 2.5 MG tablet Commonly known as: FEMARA Take 2.5 mg by mouth daily.   lisinopril 10 MG tablet Commonly known as: ZESTRIL Take 10 mg by mouth daily.   magnesium oxide 400 (240 Mg) MG tablet Commonly known as: MAG-OX Take 400 mg by mouth daily.   nystatin powder Apply 1 Application topically 2 (two) times daily as needed.   ondansetron 4 MG tablet Commonly known as: ZOFRAN Take 1 tablet (4 mg total) by mouth every 6 (six) hours as needed for nausea.   oxyCODONE 5 MG immediate release tablet Commonly known as: Oxy IR/ROXICODONE Take 1 tablet (5 mg total) by mouth every 4 (four) hours as needed for severe pain.   pantoprazole 40  MG tablet Commonly known as: PROTONIX Take 40 mg by mouth daily.   prochlorperazine 10 MG tablet Commonly known as: COMPAZINE Take 10 mg by mouth every 6 (six) hours as needed for nausea or vomiting.   psyllium 0.52 g capsule Commonly known as: REGULOID Take 1 capsule by mouth daily.   senna 8.6 MG tablet Commonly known as: SENOKOT Take 2 tablets by mouth at bedtime.   simvastatin 20 MG tablet Commonly known as: ZOCOR Take 20 mg by mouth daily.   traMADol 50 MG tablet Commonly known as: ULTRAM Take 1-2 tablets (50-100 mg total) by mouth every 4 (four) hours as needed for moderate pain. What changed:  how much to take when to take this reasons to take this   Vitamin B-12 2500 MCG Subl Place 1 tablet under the tongue daily.   Vitamin D 125 MCG (5000 UT) Caps Take 5,000 Units by mouth daily.               Durable Medical Equipment  (From admission, onward)           Start     Ordered   06/22/23 1656  DME Walker rolling  Once       Question:  Patient needs a walker to treat with the following condition  Answer:  Total knee replacement status   06/22/23 1656   06/22/23 1656  DME Bedside commode   Once       Comments: Patient is not able to walk the distance required to go the bathroom, or he/she is unable to safely negotiate stairs required to access the bathroom.  A 3in1 BSC will alleviate this problem  Question:  Patient needs a bedside commode to treat with the following condition  Answer:  Total knee replacement status   06/22/23 1656            Diagnostic Studies: DG Knee Left Port  Result Date: 06/22/2023 CLINICAL DATA:  Left knee replacement. EXAM: PORTABLE LEFT KNEE - 1-2 VIEW COMPARISON:  None Available. FINDINGS: Left knee arthroplasty in expected alignment. No periprosthetic lucency or fracture. There has been patellar resurfacing. Recent postsurgical change includes air and edema in the soft tissues and joint space. Anterior skin staples with presumed drains in place. IMPRESSION: Left knee arthroplasty without immediate postoperative complication. Electronically Signed   By: Narda Rutherford M.D.   On: 06/22/2023 16:33    Disposition: Plan for discharge home today with HHPT pending progress with PT.     Follow-up Information     Rayburn Go, PA-C Follow up on 07/05/2023.   Specialty: Orthopedic Surgery Why: at 1:15pm Contact information: [address] [place] [address] [phone]         Donato Heinz, MD Follow up on 08/07/2023.   Specialty: Orthopedic Surgery Why: at 2:30pm Contact information: [address] [facility] [address] [facility] [place] [address] [phone]                Signed: Meriel Pica PA-C 06/23/2023, 7:28 AM

## 2023-06-22 NOTE — Progress Notes (Signed)
Per Dr. Ernest Pine to not apply knee immobilizer.

## 2023-06-22 NOTE — Op Note (Signed)
OPERATIVE NOTE  DATE OF SURGERY:  06/22/2023  PATIENT NAME:  Mariah Hanson   DOB: 30-Nov-1955  MRN: [id number]  PRE-OPERATIVE DIAGNOSIS: Degenerative arthrosis of the left knee, primary  POST-OPERATIVE DIAGNOSIS:  Same  PROCEDURE:  Left total knee arthroplasty using computer-assisted navigation  SURGEON:  Jena Gauss. M.D.  ANESTHESIA: spinal  ESTIMATED BLOOD LOSS: 100 mL  FLUIDS REPLACED: 1100 mL of crystalloid  TOURNIQUET TIME: 93 minutes  DRAINS: 2 medium Hemovac drains  SOFT TISSUE RELEASES: Anterior cruciate ligament, posterior cruciate ligament, deep medial collateral ligament, patellofemoral ligament  IMPLANTS UTILIZED: DePuy Attune size 4 posterior stabilized femoral component (cemented), size 5 rotating platform tibial component (cemented), 35 mm medialized dome patella (cemented), and a 10 mm stabilized rotating platform polyethylene insert.  INDICATIONS FOR SURGERY: Mariah Hanson is a 67 y.o. year old female with a long history of progressive knee pain. X-rays demonstrated severe degenerative changes in tricompartmental fashion. The patient had not seen any significant improvement despite conservative nonsurgical intervention. After discussion of the risks and benefits of surgical intervention, the patient expressed understanding of the risks benefits and agree with plans for total knee arthroplasty.   The risks, benefits, and alternatives were discussed at length including but not limited to the risks of infection, bleeding, nerve injury, stiffness, blood clots, the need for revision surgery, cardiopulmonary complications, among others, and they were willing to proceed.  PROCEDURE IN DETAIL: The patient was brought into the operating room and, after adequate spinal anesthesia was achieved, a tourniquet was placed on the patient's upper thigh. The patient's knee and leg were cleaned and prepped with alcohol and DuraPrep and draped in the usual sterile fashion. A  "timeout" was performed as per usual protocol. The lower extremity was exsanguinated using an Esmarch, and the tourniquet was inflated to 300 mmHg. An anterior longitudinal incision was made followed by a standard mid vastus approach. The deep fibers of the medial collateral ligament were elevated in a subperiosteal fashion off of the medial flare of the tibia so as to maintain a continuous soft tissue sleeve. The patella was subluxed laterally and the patellofemoral ligament was incised. Inspection of the knee demonstrated severe degenerative changes with full-thickness loss of articular cartilage. Osteophytes were debrided using a rongeur. Anterior and posterior cruciate ligaments were excised. Two 4.0 mm Schanz pins were inserted in the femur and into the tibia for attachment of the array of trackers used for computer-assisted navigation. Hip center was identified using a circumduction technique. Distal landmarks were mapped using the computer. The distal femur and proximal tibia were mapped using the computer. The distal femoral cutting guide was positioned using computer-assisted navigation so as to achieve a 5 distal valgus cut. The femur was sized and it was felt that a size 4 femoral component was appropriate. A size 4 femoral cutting guide was positioned and the anterior cut was performed and verified using the computer. This was followed by completion of the posterior and chamfer cuts. Femoral cutting guide for the central box was then positioned in the center box cut was performed.  Attention was then directed to the proximal tibia. Medial and lateral menisci were excised. The extramedullary tibial cutting guide was positioned using computer-assisted navigation so as to achieve a 0 varus-valgus alignment and 3 posterior slope. The cut was performed and verified using the computer. The proximal tibia was sized and it was felt that a size 5 tibial tray was appropriate. Tibial and femoral trials were  inserted followed  by insertion of a 10 mm polyethylene insert. This allowed for excellent mediolateral soft tissue balancing both in flexion and in full extension. Finally, the patella was cut and prepared so as to accommodate a 35 mm medialized dome patella. A patella trial was placed and the knee was placed through a range of motion with excellent patellar tracking appreciated. The femoral trial was removed after debridement of posterior osteophytes. The central post-hole for the tibial component was reamed followed by insertion of a keel punch. Tibial trials were then removed. Cut surfaces of bone were irrigated with copious amounts of normal saline using pulsatile lavage and then suctioned dry. Polymethylmethacrylate cement with gentamicin was prepared in the usual fashion using a vacuum mixer. Cement was applied to the cut surface of the proximal tibia as well as along the undersurface of a size 5 rotating platform tibial component. Tibial component was positioned and impacted into place. Excess cement was removed using Personal assistant. Cement was then applied to the cut surfaces of the femur as well as along the posterior flanges of the size 4 femoral component. The femoral component was positioned and impacted into place. Excess cement was removed using Personal assistant. A 10 mm polyethylene trial was inserted and the knee was brought into full extension with steady axial compression applied. Finally, cement was applied to the backside of a 35 mm medialized dome patella and the patellar component was positioned and patellar clamp applied. Excess cement was removed using Personal assistant. After adequate curing of the cement, the tourniquet was deflated after a total tourniquet time of 93 minutes. Hemostasis was achieved using electrocautery. The knee was irrigated with copious amounts of normal saline using pulsatile lavage followed by 450 ml of Surgiphor and then suctioned dry. 20 mL of 1.3% Exparel and 60 mL  of 0.25% Marcaine in 40 mL of normal saline was injected along the posterior capsule, medial and lateral gutters, and along the arthrotomy site. A 10 mm stabilized rotating platform polyethylene insert was inserted and the knee was placed through a range of motion with excellent mediolateral soft tissue balancing appreciated and excellent patellar tracking noted. 2 medium drains were placed in the wound bed and brought out through separate stab incisions. The medial parapatellar portion of the incision was reapproximated using interrupted sutures of #1 Vicryl. Subcutaneous tissue was approximated in layers using first #0 Vicryl followed #2-0 Vicryl. The skin was approximated with skin staples. A sterile dressing was applied.  The patient tolerated the procedure well and was transported to the recovery room in stable condition.    Maxi Carreras P. Angie Fava., M.D.

## 2023-06-22 NOTE — Progress Notes (Signed)
PT Cancellation Note  Patient Details Name: KATIRA DUMAIS MRN: [id number] DOB: [birth year]   Cancelled Treatment:    Reason Eval/Treat Not Completed: Other (comment): Per nursing pt with no return of sensation to LE and not appropriate to participate with PT services at this time.  Will attempt to see pt at a future date/time as medically appropriate.     Ovidio Hanger PT, DPT 06/22/23, 4:18 PM

## 2023-06-22 NOTE — Anesthesia Postprocedure Evaluation (Signed)
Anesthesia Post Note  Patient: Mariah Hanson  Procedure(s) Performed: COMPUTER ASSISTED TOTAL KNEE ARTHROPLASTY - RNFA (Left: Knee)  Patient location during evaluation: PACU Anesthesia Type: Spinal Level of consciousness: awake and alert, oriented and patient cooperative Pain management: pain level controlled Vital Signs Assessment: post-procedure vital signs reviewed and stable Respiratory status: spontaneous breathing, nonlabored ventilation and respiratory function stable Cardiovascular status: blood pressure returned to baseline and stable Postop Assessment: adequate PO intake, no headache and spinal receding Anesthetic complications: no Comments: Called to bedside in PACU for back pain.  Pt describes pain as 3 out of 10 in lumbar to sacral spine, sharp, radiating to abdomen, worse with palpation, improved with lying decubitus.  Was 10 out of 10 immediately post op and has subsided with IV pain medication.  Spinal receding and pt able to move toes.  Pt states she sleeps in a recliner and never feels comfortable lying flat.  I explained that this back pain is most likely a result of lying supine on the OR table for several hours and should gradually recede with time.  I recommended that if the pain persists or worsens, or if she experiences bowel or bladder dysfunction, she should immediately tell her surgeon or PCP, or go to ER.  Pt agrees to plan. Anesthesiologist will follow up on POD #1.   No notable events documented.   Last Vitals:  Vitals:   06/22/23 1550 06/22/23 1600  BP:  102/80  Pulse: 90 89  Resp: 13 16  Temp:    SpO2: 96% 91%    Last Pain:  Vitals:   06/22/23 1550  PainSc: 3                  Reed Breech

## 2023-06-23 DIAGNOSIS — M1712 Unilateral primary osteoarthritis, left knee: Secondary | ICD-10-CM | POA: Diagnosis not present

## 2023-06-23 MED ORDER — ASPIRIN 81 MG PO CHEW: 81.0000 mg | CHEWABLE_TABLET | Freq: Two times a day (BID) | ORAL | 0 refills | Status: AC

## 2023-06-23 MED ORDER — ACETAMINOPHEN 500 MG PO TABS
1000.0000 mg | ORAL_TABLET | Freq: Four times a day (QID) | ORAL | 0 refills | Status: AC | PRN
Start: 2023-06-23 — End: ?

## 2023-06-23 MED ORDER — ONDANSETRON HCL 4 MG PO TABS: 4.0000 mg | ORAL_TABLET | Freq: Four times a day (QID) | ORAL | 0 refills | Status: AC | PRN

## 2023-06-23 MED ORDER — OXYCODONE HCL 5 MG PO TABS: 5.0000 mg | ORAL_TABLET | ORAL | 0 refills | Status: AC | PRN

## 2023-06-23 MED ORDER — CELECOXIB 200 MG PO CAPS: 200.0000 mg | ORAL_CAPSULE | Freq: Two times a day (BID) | ORAL | 0 refills | Status: AC

## 2023-06-23 MED ORDER — TRAMADOL HCL 50 MG PO TABS: 50.0000 mg | ORAL_TABLET | ORAL | 0 refills | Status: AC | PRN

## 2023-06-23 NOTE — Evaluation (Signed)
Occupational Therapy Evaluation Patient Details Name: Mariah Hanson MRN: [id number] DOB: [birth year] Today's Date: 06/23/2023   History of Present Illness 67 yo Female s/p LLE TKA on 06/22/23 due to osteoarthritis. PMH significant for Breast CA  s/p chemo and radiation and has had port removed. Did develop neuropathy; Other PMH significant for HTN, Hypercholesterolemia, GERD, Cholecystectomy, Diverticulitis (2015), DVT (2015), Obstructive sleep apnea;   Clinical Impression   Pt seen this date for OT evaluation.  Pt recently finished PT session and sitting comfortably up in chair.  No transfers or mobility assessed during this evaluation d/t pt resting after PT session.  Focus today on DME/AE needs as pt lives alone.  OT assisted pt with recommendations for increasing safety and indep with tub shower transfers, as pt reported fear with this transfer even prior to sx.  Advised on tub bar options and transfer tub bench.  Educated on use of sockaid as pt also demonstrated difficulty reaching L foot post sx.  OT advised on options to obtain above noted equipment, and OT assisted with ordering transfer tub bench, per pt's request.  3in1 already requested to be ordered by d/c planner.  Recommending HH OT to maximize safety and indep in the home and further reinforce ADL strategies with above noted AE.  Pt in agreement with plan.         If plan is discharge home, recommend the following: A little help with walking and/or transfers;A little help with bathing/dressing/bathroom;Assistance with cooking/housework;Assist for transportation;Help with stairs or ramp for entrance    Functional Status Assessment  Patient has had a recent decline in their functional status and demonstrates the ability to make significant improvements in function in a reasonable and predictable amount of time.  Equipment Recommendations  BSC/3in1    Recommendations for Other Services       Precautions / Restrictions  Precautions Precautions: Fall Restrictions Weight Bearing Restrictions: Yes LLE Weight Bearing: Weight bearing as tolerated      Mobility Bed Mobility               General bed mobility comments: NT this date; pt received and left in recliner Patient Response: Cooperative  Transfers                          Balance Overall balance assessment: Needs assistance Sitting-balance support: No upper extremity supported, Feet supported Sitting balance-Leahy Scale: Good                                     ADL either performed or assessed with clinical judgement   ADL Overall ADL's : Needs assistance/impaired                     Lower Body Dressing: Minimal assistance;Sit to/from stand Lower Body Dressing Details (indicate cue type and reason): Difficulty reaching L foot.  Advised on sockaid and daily stretches to ease reach to foot.  Advised on propping foot on stool for easier reach to foot, and assured pt improvements in knee flexion with therapy exercises would further ease reach to foot.               General ADL Comments: Functional mobility NT this date as pt had just finished PT session and was in her chair.  OT did observe pt ambulating with PT with RW and min guard.  Pt will  have 3in1 for home and pt has ordered a transfer tub bench that will be delivered Thurs d/t her reports of fear with tub transfers even prior to knee sx.     Vision Patient Visual Report: No change from baseline                         Pertinent Vitals/Pain Pain Assessment Pain Score: 2  Pain Location: left knee Pain Descriptors / Indicators: Aching, Sore Pain Intervention(s): Limited activity within patient's tolerance, Monitored during session, Ice applied     Extremity/Trunk Assessment Upper Extremity Assessment Upper Extremity Assessment: Overall WFL for tasks assessed (Although strength is [facility] for use of walker and functional transfers, pt  verbalizes difficulty pulling compression bra down behind her back.  Would benefit from UE HEP.)   Lower Extremity Assessment Lower Extremity Assessment: Defer to PT evaluation RLE Deficits / Details: s/p L TKA RLE Sensation: history of peripheral neuropathy LLE Deficits / Details: not formally assessed as post surgical; intact light touch to lower leg LLE Sensation: history of peripheral neuropathy   Cervical / Trunk Assessment Cervical / Trunk Assessment: Kyphotic   Communication Communication Communication: No apparent difficulties Cueing Techniques: Verbal cues   Cognition Arousal: Alert Behavior During Therapy: WFL for tasks assessed/performed Overall Cognitive Status: Within Functional Limits for tasks assessed                                 General Comments: alert and oriented x4     General Comments       Exercises Other Exercises Other Exercises: Educ on OT role, goals, poc         Home Living Family/patient expects to be discharged to:: Private residence Living Arrangements: Alone;Other (Comment) (sister and daughter will help prn) Available Help at Discharge: Family;Available PRN/intermittently Type of Home: House Home Access: Stairs to enter [employer] of Steps: 2 Entrance Stairs-Rails: None Home Layout: One level     Bathroom Shower/Tub: Chief Strategy Officer: Standard Bathroom Accessibility: Yes   Home Equipment: Crutches;Cane - single point          Prior Functioning/Environment Prior Level of Function : Independent/Modified Independent             Mobility Comments: was driving; working part-time at senior center; did use cane PRN for ambulation due to increased knee pain; ADLs Comments: indep with basic ADL/IADLs prior to sx.        OT Problem List: Decreased activity tolerance;Decreased knowledge of use of DME or AE;Impaired balance (sitting and/or standing);Pain      OT  Treatment/Interventions: Self-care/ADL training;Therapeutic exercise;Patient/family education;Balance training;Therapeutic activities;DME and/or AE instruction    OT Goals(Current goals can be found in the care plan section) Acute Rehab OT Goals Patient Stated Goal: Home with family assist prn OT Goal Formulation: With patient Time For Goal Achievement: 07/07/23 Potential to Achieve Goals: Good ADL Goals Pt Will Perform Lower Body Dressing: with modified independence;with adaptive equipment;sit to/from stand Pt Will Transfer to Toilet: with modified independence;ambulating;grab bars;bedside commode Additional ADL Goal #1: Pt will be indep to perform BUE HEP for increasing strength for ADLs with use of written handout.  OT Frequency: Min 1X/week                  AM-PAC OT "6 Clicks" Daily Activity     Outcome Measure Help from another person eating meals?:  None Help from another person taking care of personal grooming?: A Little Help from another person toileting, which includes using toliet, bedpan, or urinal?: A Little Help from another person bathing (including washing, rinsing, drying)?: A Little Help from another person to put on and taking off regular upper body clothing?: None Help from another person to put on and taking off regular lower body clothing?: A Little 6 Click Score: 20   End of Session    Activity Tolerance: Patient tolerated treatment well Patient left: in chair;with call bell/phone within reach;with chair alarm set  OT Visit Diagnosis: Unsteadiness on feet (R26.81);Muscle weakness (generalized) (M62.81)                Time: 1000-1027 OT Time Calculation (min): 27 min Charges:  OT General Charges $OT Visit: 1 Visit OT Evaluation $OT Eval Moderate Complexity: 1 Mod OT Treatments $Self Care/Home Management : 8-22 mins Danelle Earthly, MS, OTR/L  Otis Dials 06/23/2023, 10:43 AM

## 2023-06-23 NOTE — Plan of Care (Signed)

## 2023-06-23 NOTE — Progress Notes (Addendum)
Subjective: 1 Day Post-Op Procedure(s) (LRB): COMPUTER ASSISTED TOTAL KNEE ARTHROPLASTY - RNFA (Left) Patient reports pain as mild.   Patient is well, and has had no acute complaints or problems Plan is to go Home after hospital stay. Negative for chest pain and shortness of breath Fever: no Gastrointestinal:Negative for nausea and vomiting  Objective: Vital signs in last 24 hours: Temp:  [97.3 F (36.3 C)-98.2 F (36.8 C)] 97.9 F (36.6 C) (09/28 0541) Pulse Rate:  [75-101] 75 (09/28 0541) Resp:  [13-22] 16 (09/28 0541) BP: (96-146)/(55-84) 123/72 (09/28 0541) SpO2:  [91 %-98 %] 93 % (09/28 0541) Weight:  [91.6 kg] 91.6 kg (09/27 0931)  Intake/Output from previous day:  Intake/Output Summary (Last 24 hours) at 06/23/2023 0724 Last data filed at 06/23/2023 0530 Gross per 24 hour  Intake 940 ml  Output 2425 ml  Net -1485 ml    Intake/Output this shift: No intake/output data recorded.  Labs: No results for input(s): "HGB" in the last 72 hours. No results for input(s): "WBC", "RBC", "HCT", "PLT" in the last 72 hours. No results for input(s): "NA", "K", "CL", "CO2", "BUN", "CREATININE", "GLUCOSE", "CALCIUM" in the last 72 hours. No results for input(s): "LABPT", "INR" in the last 72 hours.   EXAM General - Patient is Alert, Appropriate, and Oriented Extremity - ABD soft Neurovascular intact Dorsiflexion/Plantar flexion intact Incision: Bulky dressing intact to the left knee No cellulitis present Compartment soft Dressing/Incision - Bulky dressing removed this AM.  Aquacell intact to the left knee.  Hemovac removed without issue.  4x4 with tegaderm applied. Motor Function - intact, moving foot and toes well on exam.  Able to perform a straight leg raise without issue. Intact bowel sounds this AM.  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   Weight as of this encounter: 91.6 kg. Advance diet Up with therapy D/C IV fluids when tolerating po intake.  Vitals reviewed this AM. Hemovac removed without issue.  4x4 with tegaderm applied. Up with therapy today. Patient is passing gas and urinating well.  Work on [employer]. Plan for discharge home today pending progress with PT.  DVT Prophylaxis - Aspirin and TED hose Weight-Bearing as tolerated to left leg  J. Horris Latino, PA-C [facility] 06/23/2023, 7:24 AM   Patient seen and examined, agree with above plan.  The patient is doing well status post left total knee arthroplasty, no  concerns at this time.  Pain is controlled.  Discussed DVT prophylaxis, pain medication use, and safe transition to home.  All questions answered the patient agrees with above plan will go home after clears PT.   Reinaldo Berber MD

## 2023-06-23 NOTE — Evaluation (Signed)
Physical Therapy Evaluation Patient Details Name: Mariah Hanson MRN: [id number] DOB: [birth year] Today's Date: 06/23/2023  History of Present Illness  67 yo Female s/p LLE TKA on 06/22/23 due to osteoarthritis. PMH significant for Breast CA  s/p chemo and radiation and has had port removed. Did develop neuropathy; Other PMH significant for HTN, Hypercholesterolemia, GERD, Cholecystectomy, Diverticulitis (2015), DVT (2015), Obstructive sleep apnea;  Clinical Impression  67 yo Female s/p LLE TKA post op day 1, reports 2/10 LLE knee pain this morning. Patient lives at home alone but reports her sister will be going home with her and will stay some as well as her daughter. She has 2 steps to enter home, inside is single level. Prior to surgery, patient was modified independent in all self care ADLs. 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 Patient motivated to move around and use the bathroom this morning. She was able to sit edge of bed with supervision, requiring extra time/effort and elevated head of bed. Patient transferred sit to stand with RW, supervision with verbal cues to advance LLE forward for comfort and cues for hand placement for safety. Patient was able to transfer to/from elevated commode with supervision. She ambulated around room x40 feet with CGA to stand by assist with RW. PT educated patient how to safely negotiate steps as she has 2 steps to enter with no railing. Patient was able to safely negotiate 2-3 steps with RW x2 sets with good positioning and safety awareness. She does require CGA for safety. Patient was given total knee arthroplasty handout and educated on home exercises for increased ROM/strengthening. She was able to complete 10 SLR on LLE with good control and tolerance. Patient would benefit from additional skilled PT Intervention to improve ROM/strength and mobility.         If plan is discharge home, recommend the following: A little help with walking and/or  transfers;A little help with bathing/dressing/bathroom;Assistance with cooking/housework;Assist for transportation;Help with stairs or ramp for entrance   Can travel by private vehicle        Equipment Recommendations Rolling walker (2 wheels);BSC/3in1  Recommendations for Other Services       Functional Status Assessment Patient has had a recent decline in their functional status and demonstrates the ability to make significant improvements in function in a reasonable and predictable amount of time.     Precautions / Restrictions Precautions Precautions: Fall Restrictions Weight Bearing Restrictions: Yes LLE Weight Bearing: Weight bearing as tolerated      Mobility  Bed Mobility Overal bed mobility: Needs Assistance Bed Mobility: Supine to Sit     Supine to sit: Supervision, HOB elevated, Used rails     General bed mobility comments: requires extra time/effort    Transfers Overall transfer level: Needs assistance Equipment used: Rolling walker (2 wheels) Transfers: Sit to/from Stand Sit to Stand: Supervision           General transfer comment: with verbal cues to advance LLE forward and cues for hand placement for safety; transferred sit to stand from elevated toilet and recliner this session;    Ambulation/Gait Ambulation/Gait assistance: Contact guard assist Gait Distance (Feet): 40 Feet Assistive device: Rolling walker (2 wheels) Gait Pattern/deviations: Step-to pattern, Step-through pattern, Trunk flexed Gait velocity: decreased     General Gait Details: able to weight bear in LLE well with no apparent distress or buckling; does require extra time and guards with increased weight bearing in BUE;  Stairs Stairs: Yes Stairs assistance: Contact guard  assist Stair Management: Step to pattern, Backwards, Forwards, With walker Number of Stairs: 3 General stair comments: pt negotiated 2-3 steps x2 reps with RW, going up stairs backwards and down steps  forward; able to verbalize and demonstrate safety with ascend with RLE (stronger leg) and descend with LLE (Weaker leg) first.  Wheelchair Mobility     Tilt Bed    Modified Rankin (Stroke Patients Only)       Balance Overall balance assessment: Needs assistance Sitting-balance support: No upper extremity supported, Feet supported Sitting balance-Leahy Scale: Good     Standing balance support: No upper extremity supported Standing balance-Leahy Scale: Good Standing balance comment: able to stand without holding onto walker with good stance control; does require RW support during ambulation demonstrating fair dynamic standing balance.                             Pertinent Vitals/Pain Pain Assessment Pain Assessment: 0-10 Pain Score: 2  Pain Location: left knee Pain Descriptors / Indicators: Aching, Sore Pain Intervention(s): Limited activity within patient's tolerance, Monitored during session, RN gave pain meds during session, Repositioned    Home Living Family/patient expects to be discharged to:: Private residence Living Arrangements: Alone;Other (Comment) (sister and daughter will help prn) Available Help at Discharge: Family;Available PRN/intermittently Type of Home: House Home Access: Stairs to enter Entrance Stairs-Rails: None Entrance Stairs-Number of Steps: 2   Home Layout: One level Home Equipment: Crutches;Cane - single point      Prior Function Prior Level of Function : Independent/Modified Independent             Mobility Comments: was driving; working part-time at senior center; did use cane PRN for ambulation due to increased knee pain;       Extremity/Trunk Assessment   Upper Extremity Assessment Upper Extremity Assessment: Overall WFL for tasks assessed    Lower Extremity Assessment Lower Extremity Assessment: RLE deficits/detail;LLE deficits/detail RLE Deficits / Details: strength grossly 4/5 RLE Sensation: history of  peripheral neuropathy LLE Deficits / Details: not formally assessed as post surgical; intact light touch to lower leg LLE Sensation: history of peripheral neuropathy    Cervical / Trunk Assessment Cervical / Trunk Assessment: Kyphotic  Communication   Communication Communication: No apparent difficulties Cueing Techniques: Verbal cues  Cognition Arousal: Alert Behavior During Therapy: WFL for tasks assessed/performed Overall Cognitive Status: Within Functional Limits for tasks assessed                                 General Comments: alert and oriented x4        General Comments      Exercises Total Joint Exercises Straight Leg Raises: AROM, Left, 10 reps, Supine Goniometric ROM: when sitting in recliner, LLE: AROM: 5-52 degrees Other Exercises Other Exercises: provided TKA home exercise handout. Instructed patient in exercise verbally; Will reinforce as patient tolerates. Patient verbalized understanding;   Assessment/Plan    PT Assessment Patient needs continued PT services  PT Problem List Decreased strength;Pain;Decreased range of motion;Decreased activity tolerance;Decreased balance;Decreased mobility       PT Treatment Interventions DME instruction;Balance training;Gait training;Stair training;Functional mobility training;Patient/family education;Therapeutic activities;Therapeutic exercise    PT Goals (Current goals can be found in the Care Plan section)  Acute Rehab PT Goals Patient Stated Goal: "To go home and get stronger" PT Goal Formulation: With patient Time For Goal Achievement: 07/07/23 Potential to  Achieve Goals: Good    Frequency BID     Co-evaluation               AM-PAC PT "6 Clicks" Mobility  Outcome Measure Help needed turning from your back to your side while in a flat bed without using bedrails?: None Help needed moving from lying on your back to sitting on the side of a flat bed without using bedrails?: A Little Help  needed moving to and from a bed to a chair (including a wheelchair)?: A Little Help needed standing up from a chair using your arms (e.g., wheelchair or bedside chair)?: None Help needed to walk in hospital room?: A Little Help needed climbing 3-5 steps with a railing? : A Little 6 Click Score: 20    End of Session Equipment Utilized During Treatment: Gait belt Activity Tolerance: Patient tolerated treatment well;No increased pain Patient left: in chair;with call bell/phone within reach;with chair alarm set;with nursing/sitter in room Nurse Communication: Mobility status PT Visit Diagnosis: Other abnormalities of gait and mobility (R26.89);Pain Pain - Right/Left: Left Pain - part of body: Knee    Time: 6578-4696 PT Time Calculation (min) (ACUTE ONLY): 54 min   Charges:   PT Evaluation $PT Eval Moderate Complexity: 1 Mod PT Treatments $Gait Training: 8-22 mins $Therapeutic Exercise: 8-22 mins PT General Charges $$ ACUTE PT VISIT: 1 Visit          Alexandrina Fiorini PT, DPT 06/23/2023, 10:16 AM

## 2023-06-23 NOTE — Progress Notes (Signed)
Patient is not able to walk the distance required to go the bathroom, or he/she is unable to safely negotiate stairs required to access the bathroom.  A BSC will alleviate this problem  Kemper Durie, RN, MSN, CCM [facility] Registered Nurse Care Manager

## 2023-06-23 NOTE — Anesthesia Postprocedure Evaluation (Signed)
Anesthesia Post Note  Patient: Mariah Hanson  Procedure(s) Performed: COMPUTER ASSISTED TOTAL KNEE ARTHROPLASTY - RNFA (Left: Knee)  Patient location during evaluation: Nursing Unit Anesthesia Type: Spinal Level of consciousness: oriented and awake and alert Pain management: pain level controlled Vital Signs Assessment: post-procedure vital signs reviewed and stable Respiratory status: spontaneous breathing Cardiovascular status: blood pressure returned to baseline and stable Postop Assessment: no headache, no backache, no apparent nausea or vomiting and patient able to bend at knees Anesthetic complications: no Comments: Reports no back pain. That her back feels normal and that she never had any increased back pack. Reports that her legs are neurologically intact.   No notable events documented.   Last Vitals:  Vitals:   06/23/23 0000 06/23/23 0541  BP: 100/64 123/72  Pulse: 81 75  Resp: 18 16  Temp: (!) 36.3 C 36.6 C  SpO2: 95% 93%    Last Pain:  Vitals:   06/23/23 0630  TempSrc:   PainSc: 2                  Cleda Mccreedy Rachele Lamaster

## 2023-06-23 NOTE — TOC Transition Note (Signed)
Transition of Care [facility]) - CM/SW Discharge Note   Patient Details  Name: Mariah Hanson MRN: [id number] Date of Birth: 1956-05-30  Transition of Care [facility]) CM/SW Contact:  Kemper Durie, RN Phone Number: 06/23/2023, 10:51 AM   Clinical Narrative:     Patient has discharge orders home today.  Lives alone, will have family to transport home and be available for support.  Dr. Tonna Boehringer is PCP, medications obtained from [facility].  Aware of orders for rolling walker and BSC as well as PT/OT.  Agrees to DME, spoke with Selena Batten and will be delivered to bedside.  Laurelyn Sickle with [facility] notified that patient will go home today with PT/OT orders.   Final next level of care: Home w Home Health Services Barriers to Discharge: Barriers Resolved   Patient Goals and CMS Choice   Choice offered to / list presented to : Patient  Discharge Placement                         Discharge Plan and Services Additional resources added to the After Visit Summary for                  DME Arranged: Walker rolling, Bedside commode DME Agency: [employer] Date DME Agency Contacted: 06/23/23 Time DME Agency Contacted: 1051 Representative spoke with at DME Agency: Selena Batten HH Arranged: PT, OT HH Agency: [facility] Date [facility] Agency Contacted: 06/23/23 Time HH Agency Contacted: 1051 Representative spoke with at [facility] Agency: Laurelyn Sickle  Social Determinants of Health (SDOH) Interventions SDOH Screenings   Food Insecurity: No Food Insecurity (06/22/2023)  Housing: Low Risk  (06/22/2023)  Transportation Needs: No Transportation Needs (06/22/2023)  Utilities: Not At Risk (06/22/2023)  Financial Resource Strain: Low Risk  (12/06/2022)   Received from [facility]  Tobacco Use: Medium Risk (06/22/2023)     Readmission Risk Interventions     No data to display

## 2023-06-24 ENCOUNTER — Encounter: Payer: Self-pay | Admitting: Orthopedic Surgery

## 2023-09-12 ENCOUNTER — Ambulatory Visit
Admission: RE | Admit: 2023-09-12 | Discharge: 2023-09-12 | Disposition: A | Discharge status: Home / Self Care | Payer: Medicare HMO | Source: Ambulatory Visit

## 2023-09-12 ENCOUNTER — Other Ambulatory Visit
Admission: RE | Admit: 2023-09-12 | Discharge: 2023-09-12 | Disposition: A | Discharge status: Home / Self Care | Payer: Medicare HMO | Source: Ambulatory Visit | Attending: Student | Admitting: Student

## 2023-09-12 ENCOUNTER — Other Ambulatory Visit: Payer: Self-pay | Admitting: Student

## 2023-09-12 ENCOUNTER — Ambulatory Visit
Admission: RE | Admit: 2023-09-12 | Discharge: 2023-09-12 | Disposition: A | Discharge status: Home / Self Care | Payer: Medicare HMO | Source: Ambulatory Visit | Attending: Student | Admitting: Student

## 2023-09-12 DIAGNOSIS — M25472 Effusion, left ankle: Secondary | ICD-10-CM | POA: Insufficient documentation

## 2023-09-12 DIAGNOSIS — M25572 Pain in left ankle and joints of left foot: Secondary | ICD-10-CM | POA: Diagnosis present

## 2023-09-12 LAB — D-DIMER, QUANTITATIVE: D-Dimer, Quant: 1.25 ug{FEU}/mL — ABNORMAL HIGH (ref 0.00–0.50)
